# Patient Record
Sex: Male | Born: 1970 | Race: Black or African American | Hispanic: No | Marital: Married | Smoking: Never smoker
Health system: Southern US, Community
[De-identification: ages and names within clinical notes are randomized; demographics above are authoritative.]

## PROBLEM LIST (undated history)

## (undated) DIAGNOSIS — F419 Anxiety disorder, unspecified: Secondary | ICD-10-CM

## (undated) DIAGNOSIS — J392 Other diseases of pharynx: Secondary | ICD-10-CM

## (undated) DIAGNOSIS — J45909 Unspecified asthma, uncomplicated: Secondary | ICD-10-CM

## (undated) DIAGNOSIS — J349 Unspecified disorder of nose and nasal sinuses: Secondary | ICD-10-CM

## (undated) DIAGNOSIS — R12 Heartburn: Secondary | ICD-10-CM

## (undated) DIAGNOSIS — G4730 Sleep apnea, unspecified: Secondary | ICD-10-CM

## (undated) DIAGNOSIS — I10 Essential (primary) hypertension: Secondary | ICD-10-CM

## (undated) HISTORY — DX: Heartburn: R12

## (undated) HISTORY — DX: Anxiety disorder, unspecified: F41.9

## (undated) HISTORY — DX: Sleep apnea, unspecified: G47.30

## (undated) HISTORY — PX: ROTATOR CUFF REPAIR: SHX139

## (undated) HISTORY — DX: Other diseases of pharynx: J39.2

## (undated) HISTORY — PX: HERNIA REPAIR: SHX51

## (undated) HISTORY — DX: Unspecified disorder of nose and nasal sinuses: J34.9

## (undated) HISTORY — DX: Unspecified asthma, uncomplicated: J45.909

## (undated) HISTORY — PX: SHOULDER SURGERY: SHX246

## (undated) HISTORY — PX: ANKLE FRACTURE SURGERY: SHX122

---

## 2014-03-03 DIAGNOSIS — F419 Anxiety disorder, unspecified: Secondary | ICD-10-CM | POA: Insufficient documentation

## 2014-03-03 DIAGNOSIS — I10 Essential (primary) hypertension: Secondary | ICD-10-CM | POA: Insufficient documentation

## 2014-03-22 ENCOUNTER — Ambulatory Visit: Payer: Commercial Managed Care - PPO | Attending: Foot and Ankle Surgery

## 2014-03-22 NOTE — Pre-Procedure Instructions (Addendum)
Patient had all pre-ops done with PCP. Will fax surgeon for results @7 [phone]. [phone] ext [phone].  Abx order faxed to pharmacy. Pre op orders entered  Patient suffers from severe anxiety especially  With surgeries. Requests give "heavy" meds when going under anesth.because he becomes combative. Will fax [facility] regarding mild stroke.   Pt stated he wished to have Hibiclens instructions as well as tips for surgery faxed to him and I did.  [facility] hospital had no info about patient so I have asked him to clarify which hospital might have the records

## 2014-03-26 NOTE — Pre-Procedure Instructions (Signed)
Spoke with patient re mild stroke. He said I misunderstood that he did not have mild stroke.

## 2014-03-29 NOTE — Pre-Procedure Instructions (Addendum)
refaxed surgeon for H/P and consent. Refaxed Romeo Apple for ekg strip @ [phone]

## 2014-03-29 NOTE — Anesthesia Preprocedure Evaluation (Addendum)
Anesthesia Evaluation    AIRWAY    Mallampati: I    TM distance: >3 FB  Neck ROM: full  Mouth Opening:full   CARDIOVASCULAR    cardiovascular exam normal       DENTAL    No notable dental hx     PULMONARY    pulmonary exam normal     OTHER FINDINGS          PSS Anesthesia Comments: HTN; Anxiety; OSA; Asthma; labs - WNL;  EKG - NSR, WNL; Medical clearance under "Notes" - "H&P" -J.Wu        Anesthesia Plan    ASA 2     general               (Risks and benefits with the common expectations discussed and patient wishes to proceed. )      intravenous induction   Detailed anesthesia plan: general LMA        Post op pain management: PNB single shot    informed consent obtained      pertinent labs reviewed

## 2014-03-30 ENCOUNTER — Ambulatory Visit: Payer: Commercial Managed Care - PPO | Admitting: Foot and Ankle Surgery

## 2014-03-30 ENCOUNTER — Encounter: Payer: Self-pay | Admitting: Physician Assistant

## 2014-03-30 ENCOUNTER — Ambulatory Visit: Payer: Commercial Managed Care - PPO | Admitting: Physician Assistant

## 2014-03-30 ENCOUNTER — Ambulatory Visit
Admission: RE | Admit: 2014-03-30 | Discharge: 2014-04-01 | Disposition: A | Discharge status: Home / Self Care | Payer: Commercial Managed Care - PPO | Source: Ambulatory Visit | Attending: Foot and Ankle Surgery | Admitting: Foot and Ankle Surgery

## 2014-03-30 ENCOUNTER — Encounter
Admission: RE | Disposition: A | Discharge status: Home / Self Care | Payer: Self-pay | Source: Ambulatory Visit | Attending: Foot and Ankle Surgery

## 2014-03-30 DIAGNOSIS — M959 Acquired deformity of musculoskeletal system, unspecified: Secondary | ICD-10-CM | POA: Insufficient documentation

## 2014-03-30 DIAGNOSIS — M6580 Other synovitis and tenosynovitis, unspecified site: Secondary | ICD-10-CM | POA: Insufficient documentation

## 2014-03-30 DIAGNOSIS — M24873 Other specific joint derangements of unspecified ankle, not elsewhere classified: Secondary | ICD-10-CM | POA: Insufficient documentation

## 2014-03-30 DIAGNOSIS — M898X9 Other specified disorders of bone, unspecified site: Secondary | ICD-10-CM | POA: Insufficient documentation

## 2014-03-30 DIAGNOSIS — M958 Other specified acquired deformities of musculoskeletal system: Secondary | ICD-10-CM | POA: Diagnosis present

## 2014-03-30 HISTORY — PX: ARTHROSCOPY, ANKLE: SHX3169

## 2014-03-30 HISTORY — PX: STABILIZATION, ANKLE, BROSTROM PROCEDURE: SHX5553

## 2014-03-30 HISTORY — PX: ARTHROPLASTY, ANKLE: SHX3097

## 2014-03-30 SURGERY — ARTHROSCOPY, ANKLE
Anesthesia: Anesthesia General | Site: Ankle | Laterality: Left | Wound class: Clean

## 2014-03-30 MED ORDER — CEFAZOLIN SODIUM 1 G IJ SOLR
INTRAMUSCULAR | Status: AC
Start: 2014-03-30 — End: ?
  Filled 2014-03-30: qty 1000

## 2014-03-30 MED ORDER — SODIUM CHLORIDE 0.9 % IV MBP
1.0000 g | Freq: Once | INTRAVENOUS | Status: AC
Start: 2014-03-30 — End: 2014-03-30
  Administered 2014-03-30: 1 g via INTRAVENOUS

## 2014-03-30 MED ORDER — ONDANSETRON HCL 4 MG/2ML IJ SOLN
4.0000 mg | Freq: Once | INTRAMUSCULAR | Status: DC | PRN
Start: 2014-03-30 — End: 2014-03-30

## 2014-03-30 MED ORDER — FAMOTIDINE 10 MG/ML IV SOLN (WRAP)
INTRAVENOUS | Status: DC | PRN
Start: 2014-03-30 — End: 2014-03-30
  Administered 2014-03-30: 20 mg via INTRAVENOUS

## 2014-03-30 MED ORDER — SODIUM CHLORIDE 0.9 % IR SOLN
Status: DC | PRN
Start: 2014-03-30 — End: 2014-03-30
  Administered 2014-03-30: 1000 mL

## 2014-03-30 MED ORDER — MIDAZOLAM HCL 2 MG/2ML IJ SOLN
INTRAMUSCULAR | Status: AC
Start: 2014-03-30 — End: ?
  Filled 2014-03-30: qty 2

## 2014-03-30 MED ORDER — HYDROMORPHONE HCL PF 1 MG/ML IJ SOLN
INTRAMUSCULAR | Status: AC
Start: 2014-03-30 — End: 2014-03-30
  Administered 2014-03-30: 0.5 mg via INTRAVENOUS
  Filled 2014-03-30: qty 1

## 2014-03-30 MED ORDER — LORAZEPAM 1 MG PO TABS
1.0000 mg | ORAL_TABLET | Freq: Every day | ORAL | Status: DC
Start: 2014-03-30 — End: 2014-04-01
  Administered 2014-03-30 – 2014-03-31 (×2): 1 mg via ORAL
  Filled 2014-03-30 (×3): qty 1

## 2014-03-30 MED ORDER — ONDANSETRON HCL 4 MG/2ML IJ SOLN
INTRAMUSCULAR | Status: DC | PRN
Start: 2014-03-30 — End: 2014-03-30
  Administered 2014-03-30: 4 mg via INTRAVENOUS

## 2014-03-30 MED ORDER — OXYCODONE HCL 5 MG PO TABS
15.0000 mg | ORAL_TABLET | ORAL | Status: DC | PRN
Start: 2014-03-30 — End: 2014-04-01
  Administered 2014-03-30 – 2014-03-31 (×6): 15 mg via ORAL
  Filled 2014-03-30 (×6): qty 3

## 2014-03-30 MED ORDER — HEPARIN SODIUM (PORCINE) 5000 UNIT/ML IJ SOLN
INTRAMUSCULAR | Status: AC
Start: 2014-03-30 — End: ?
  Filled 2014-03-30: qty 1

## 2014-03-30 MED ORDER — OXYCODONE HCL ER 10 MG PO T12A
10.0000 mg | EXTENDED_RELEASE_TABLET | Freq: Two times a day (BID) | ORAL | Status: DC
Start: 2014-03-30 — End: 2014-04-01
  Administered 2014-03-30 – 2014-03-31 (×3): 10 mg via ORAL
  Filled 2014-03-30 (×3): qty 1

## 2014-03-30 MED ORDER — FAMOTIDINE 20 MG/2ML IV SOLN
INTRAVENOUS | Status: AC
Start: 2014-03-30 — End: ?
  Filled 2014-03-30: qty 2

## 2014-03-30 MED ORDER — FENTANYL CITRATE 0.05 MG/ML IJ SOLN
INTRAMUSCULAR | Status: AC
Start: 2014-03-30 — End: ?
  Filled 2014-03-30: qty 2

## 2014-03-30 MED ORDER — DEXAMETHASONE SODIUM PHOSPHATE 4 MG/ML IJ SOLN (WRAP)
INTRAMUSCULAR | Status: DC | PRN
Start: 2014-03-30 — End: 2014-03-30
  Administered 2014-03-30: 4 mg via INTRAVENOUS

## 2014-03-30 MED ORDER — BUPIVACAINE HCL (PF) 0.5 % IJ SOLN
INTRAMUSCULAR | Status: AC
Start: 2014-03-30 — End: ?
  Filled 2014-03-30: qty 30

## 2014-03-30 MED ORDER — CEFAZOLIN SODIUM-DEXTROSE 2-3 GM-% IV SOLR
2.0000 g | Freq: Three times a day (TID) | INTRAVENOUS | Status: AC
Start: 2014-03-30 — End: 2014-03-31
  Administered 2014-03-30 – 2014-03-31 (×2): 2 g via INTRAVENOUS
  Filled 2014-03-30 (×2): qty 50

## 2014-03-30 MED ORDER — KETAMINE HCL 100 MG/ML IJ SOLN
INTRAMUSCULAR | Status: DC | PRN
Start: 2014-03-30 — End: 2014-03-30
  Administered 2014-03-30 (×2): 5 mg via INTRAVENOUS

## 2014-03-30 MED ORDER — ENOXAPARIN SODIUM 40 MG/0.4ML SC SOLN
40.0000 mg | Freq: Every morning | SUBCUTANEOUS | Status: DC
Start: 2014-03-31 — End: 2014-04-01
  Administered 2014-03-31 – 2014-04-01 (×2): 40 mg via SUBCUTANEOUS
  Filled 2014-03-30 (×2): qty 0.4

## 2014-03-30 MED ORDER — KETAMINE HCL 50 MG/ML IJ SOLN
INTRAMUSCULAR | Status: AC
Start: 2014-03-30 — End: ?
  Filled 2014-03-30: qty 10

## 2014-03-30 MED ORDER — SODIUM CHLORIDE 0.9% BAG (IRRIGATION USE)
INTRAVENOUS | Status: DC | PRN
Start: 2014-03-30 — End: 2014-03-30
  Administered 2014-03-30: 1000 mL

## 2014-03-30 MED ORDER — DEXAMETHASONE SOD PHOSPHATE PF 10 MG/ML IJ SOLN
INTRAMUSCULAR | Status: AC
Start: 2014-03-30 — End: ?
  Filled 2014-03-30: qty 1

## 2014-03-30 MED ORDER — PROPOFOL INFUSION 10 MG/ML
INTRAVENOUS | Status: DC | PRN
Start: 2014-03-30 — End: 2014-03-30
  Administered 2014-03-30: 30 mg via INTRAVENOUS
  Administered 2014-03-30: 50 mg via INTRAVENOUS
  Administered 2014-03-30: 40 mg via INTRAVENOUS
  Administered 2014-03-30: 30 mg via INTRAVENOUS
  Administered 2014-03-30: 40 mg via INTRAVENOUS

## 2014-03-30 MED ORDER — OXYCODONE-ACETAMINOPHEN 5-325 MG PO TABS
1.0000 | ORAL_TABLET | Freq: Once | ORAL | Status: DC | PRN
Start: 2014-03-30 — End: 2014-03-30

## 2014-03-30 MED ORDER — FENTANYL CITRATE 0.05 MG/ML IJ SOLN
INTRAMUSCULAR | Status: AC
Start: 2014-03-30 — End: 2014-03-30
  Administered 2014-03-30: 50 ug via INTRAVENOUS
  Filled 2014-03-30: qty 2

## 2014-03-30 MED ORDER — ROPIVACAINE HCL 5 MG/ML IJ SOLN
INTRAMUSCULAR | Status: AC
Start: 2014-03-30 — End: ?
  Filled 2014-03-30: qty 60

## 2014-03-30 MED ORDER — SODIUM CHLORIDE 0.9 % IV SOLN
INTRAVENOUS | Status: DC
Start: 2014-03-30 — End: 2014-03-30

## 2014-03-30 MED ORDER — MINERAL OIL LIGHT 100 % EX OIL
TOPICAL_OIL | CUTANEOUS | Status: AC
Start: 2014-03-30 — End: ?
  Filled 2014-03-30: qty 25

## 2014-03-30 MED ORDER — ALBUTEROL SULFATE HFA 108 (90 BASE) MCG/ACT IN AERS
2.0000 | INHALATION_SPRAY | Freq: Four times a day (QID) | RESPIRATORY_TRACT | Status: DC | PRN
Start: 2014-03-30 — End: 2014-04-01
  Filled 2014-03-30: qty 1

## 2014-03-30 MED ORDER — PROPOFOL INFUSION 10 MG/ML
INTRAVENOUS | Status: DC | PRN
Start: 2014-03-30 — End: 2014-03-30
  Administered 2014-03-30: 50 ug/kg/min via INTRAVENOUS

## 2014-03-30 MED ORDER — BUPIVACAINE-EPINEPHRINE (PF) 0.5% -1:200000 IJ SOLN
INTRAMUSCULAR | Status: AC
Start: 2014-03-30 — End: ?
  Filled 2014-03-30: qty 30

## 2014-03-30 MED ORDER — FENTANYL CITRATE 0.05 MG/ML IJ SOLN
50.0000 ug | INTRAMUSCULAR | Status: DC | PRN
Start: 2014-03-30 — End: 2014-03-30
  Administered 2014-03-30: 50 ug via INTRAVENOUS

## 2014-03-30 MED ORDER — CALCIUM CITRATE-VITAMIN D 315-250 MG-UNIT PO TABS
2.0000 | ORAL_TABLET | Freq: Every morning | ORAL | Status: DC
Start: 2014-03-31 — End: 2014-04-01
  Administered 2014-03-31 – 2014-04-01 (×2): 2 via ORAL
  Filled 2014-03-30 (×2): qty 2

## 2014-03-30 MED ORDER — HYDROMORPHONE HCL PF 1 MG/ML IJ SOLN
1.0000 mg | INTRAMUSCULAR | Status: DC | PRN
Start: 2014-03-30 — End: 2014-04-01
  Administered 2014-03-31: 1 mg via INTRAVENOUS
  Filled 2014-03-30: qty 1

## 2014-03-30 MED ORDER — BUPIVACAINE-EPINEPHRINE (PF) 0.5% -1:200000 IJ SOLN
INTRAMUSCULAR | Status: DC | PRN
Start: 2014-03-30 — End: 2014-03-30
  Administered 2014-03-30: 10 mL

## 2014-03-30 MED ORDER — NALOXONE HCL 0.4 MG/ML IJ SOLN
0.4000 mg | INTRAMUSCULAR | Status: DC | PRN
Start: 2014-03-30 — End: 2014-04-01

## 2014-03-30 MED ORDER — FENTANYL CITRATE 0.05 MG/ML IJ SOLN
INTRAMUSCULAR | Status: DC | PRN
Start: 2014-03-30 — End: 2014-03-30
  Administered 2014-03-30 (×2): 50 ug via INTRAVENOUS

## 2014-03-30 MED ORDER — AMLODIPINE BESYLATE 5 MG PO TABS
10.0000 mg | ORAL_TABLET | Freq: Every day | ORAL | Status: DC
Start: 2014-03-31 — End: 2014-04-01
  Administered 2014-03-31 – 2014-04-01 (×2): 10 mg via ORAL
  Filled 2014-03-30 (×2): qty 2

## 2014-03-30 MED ORDER — GLYCOPYRROLATE 0.2 MG/ML IJ SOLN
INTRAMUSCULAR | Status: DC | PRN
Start: 2014-03-30 — End: 2014-03-30
  Administered 2014-03-30: 0.2 mg via INTRAVENOUS
  Administered 2014-03-30: 0.1 mg via INTRAVENOUS

## 2014-03-30 MED ORDER — LIDOCAINE HCL (PF) 1 % IJ SOLN
INTRAMUSCULAR | Status: AC
Start: 2014-03-30 — End: ?
  Filled 2014-03-30: qty 30

## 2014-03-30 MED ORDER — DEXAMETHASONE SODIUM PHOSPHATE 4 MG/ML IJ SOLN
INTRAMUSCULAR | Status: AC
Start: 2014-03-30 — End: ?
  Filled 2014-03-30: qty 1

## 2014-03-30 MED ORDER — ASPIRIN 325 MG PO TBEC
325.0000 mg | DELAYED_RELEASE_TABLET | Freq: Every morning | ORAL | Status: DC
Start: 2014-03-31 — End: 2014-04-01
  Filled 2014-03-30 (×2): qty 1

## 2014-03-30 MED ORDER — LACTATED RINGERS IV SOLN
INTRAVENOUS | Status: DC
Start: 2014-03-30 — End: 2014-03-30

## 2014-03-30 MED ORDER — MIDAZOLAM HCL 2 MG/2ML IJ SOLN
2.0000 mg | Freq: Once | INTRAMUSCULAR | Status: AC
Start: 2014-03-30 — End: 2014-03-30
  Administered 2014-03-30: 2 mg via INTRAVENOUS

## 2014-03-30 MED ORDER — MEPERIDINE HCL 25 MG/ML IJ SOLN
12.5000 mg | Freq: Once | INTRAMUSCULAR | Status: DC | PRN
Start: 2014-03-30 — End: 2014-03-30

## 2014-03-30 MED ORDER — ONDANSETRON HCL 4 MG/2ML IJ SOLN
INTRAMUSCULAR | Status: AC
Start: 2014-03-30 — End: ?
  Filled 2014-03-30: qty 2

## 2014-03-30 MED ORDER — HYDROMORPHONE HCL PF 1 MG/ML IJ SOLN
0.5000 mg | INTRAMUSCULAR | Status: DC | PRN
Start: 2014-03-30 — End: 2014-03-30

## 2014-03-30 MED ORDER — OXYCODONE HCL 5 MG PO TABS
10.0000 mg | ORAL_TABLET | ORAL | Status: DC | PRN
Start: 2014-03-30 — End: 2014-04-01

## 2014-03-30 MED ORDER — PROMETHAZINE HCL 25 MG/ML IJ SOLN
6.2500 mg | Freq: Once | INTRAMUSCULAR | Status: DC | PRN
Start: 2014-03-30 — End: 2014-03-30

## 2014-03-30 MED ORDER — OXYCODONE HCL 5 MG PO TABS
5.0000 mg | ORAL_TABLET | ORAL | Status: DC | PRN
Start: 2014-03-30 — End: 2014-04-01

## 2014-03-30 MED ORDER — LIDOCAINE HCL 2 % IJ SOLN
INTRAMUSCULAR | Status: DC | PRN
Start: 2014-03-30 — End: 2014-03-30
  Administered 2014-03-30: 40 mg

## 2014-03-30 MED ORDER — PROPOFOL 10 MG/ML IV EMUL
INTRAVENOUS | Status: AC
Start: 2014-03-30 — End: ?
  Filled 2014-03-30: qty 60

## 2014-03-30 MED ORDER — LACTATED RINGERS IV SOLN
INTRAVENOUS | Status: DC
Start: 2014-03-30 — End: 2014-04-01

## 2014-03-30 MED ORDER — ROPIVACAINE HCL 5 MG/ML IJ SOLN
INTRAMUSCULAR | Status: DC | PRN
Start: 2014-03-30 — End: 2014-03-30
  Administered 2014-03-30: 20 mL via PERINEURAL
  Administered 2014-03-30: 30 mL via PERINEURAL

## 2014-03-30 MED ORDER — MIDAZOLAM HCL 2 MG/2ML IJ SOLN
2.0000 mg | Freq: Once | INTRAMUSCULAR | Status: DC
Start: 2014-03-30 — End: 2014-03-30

## 2014-03-30 MED ORDER — MIDAZOLAM HCL 2 MG/2ML IJ SOLN
INTRAMUSCULAR | Status: DC | PRN
Start: 2014-03-30 — End: 2014-03-30
  Administered 2014-03-30: 2 mg via INTRAVENOUS

## 2014-03-30 SURGICAL SUPPLY — 72 items
BANDAGE CMPR PLSTR CTTN CRTY CNFRM 75X4 (Bandage)
BANDAGE CMPR PLSTR CTTN MED MTRX 5YDX6IN (Bandage) ×2
BANDAGE CURITY CONFORM COMPRESSION L75 IN X W4 IN 1 PLY HIGH ABSORBENT (Bandage) IMPLANT
BANDAGE ELASTIC L5 YD X W6 IN W/SELF-CLOSURE HOOK (Bandage) ×2 IMPLANT
BANDAGE MEDIUM COMPRESSION L5 YD X W4 IN ELASTIC HOOK LOOP CLOSURE (Bandage) ×1 IMPLANT
BANDAGE MEDLINE MEDIUM COMPRESSION L5 YD (Bandage) ×3
BLADE S/SU RIBBACK CARB STL 15 (Blade) ×8 IMPLANT
BNDG ACE ELASTIC 4IN STRL (Procedure Accessories) ×4 IMPLANT
BNDG MTRX CMPR 5YDX4IN MED PLSTR CTTN (Bandage) ×1
CLOTH BEACON TIMEOUT ORANGE (Other) ×2 IMPLANT
CNSR MEDIVAC CRD LINER W LID (Suction) ×2 IMPLANT
COVER LIGHT HANDLE 2PK (Procedure Accessories) ×4 IMPLANT
DRAPE 3/4 SHEET FANFLD 52X76IN (Drape) ×2 IMPLANT
DRESSING OIL EMULSION 3X8 (Dressing) IMPLANT
GLOVE SRG NTR RBR 8 INDCTR BGL 299X103MM (Glove) ×1
GLOVE SURG BIOGEL INDIC SZ 7.5 (Glove) ×2 IMPLANT
GLOVE SURG BIOGEL ORTHO SZ7.5 (Glove) ×2 IMPLANT
GLOVE SURG BIOGEL SZ7.5 (Glove) ×4 IMPLANT
GLOVE SURGICAL 8 INDICATOR BIOGEL POWDER (Glove) ×1
GLOVE SURGICAL 8 INDICATOR BIOGEL POWDER FREE SMOOTH BEAD CUFF (Glove) ×1 IMPLANT
GOWN SMART IMPERVIOUS LARGE (Gown) ×6 IMPLANT
GRAFT BN ALGRF FSH LT (Allograft) ×2 IMPLANT
GRAFT BONE ALLOGRAFT FRESH LEFT (Allograft) ×1 IMPLANT
HNDPC INTRPLS SUCT (Other) ×2 IMPLANT
INACTIVE USE LAWSON 93583 (Gown) ×2 IMPLANT
JUG OMNI 15000CC W SPECIMEN CA (Suction) ×2 IMPLANT
KIT INFECTION CONTROL CUSTOM (Kits) ×2
KIT INFECTION CONTROL CUSTOM IFOH03 (Kits) ×1 IMPLANT
KIT OATS 8MM SMALL JOINT (Ortho Supply) ×2
MODULE KNEE ARTHROSCOPY FOH (Pack) ×2 IMPLANT
PACK DRAPE C-ARM FIT MINI-6600 (Pack) ×2 IMPLANT
PACK SRG 60ML BMAC LF STRL BN MRW DISP (Pack) ×2
PACK SURGICAL BMAC BONE MARROW STERILE DISPOSABLE LATEX FREE 60ML (Pack) ×1 IMPLANT
PAD ABD PVC CRTY 9X5IN LF STRL 3 LYR (Dressing) ×4 IMPLANT
PADDING CAST L4 YD X W4 IN COHESION HAND TEARABLE SELF BOND SPECIALIST (Procedure Accessories) ×1 IMPLANT
PADDING CAST L4 YD X W4 IN UNDERCAST (Cast) ×3
PADDING CAST L4 YD X W4 IN UNDERCAST MILD STRETCH COHESIVE REGULAR (Cast) ×3 IMPLANT
PADDING CST CTTN SPCLST 100 4YDX4IN LF (Procedure Accessories) ×2
PADDING CST CTTN WBRL 4YDX4IN LF STRL (Cast) ×3
PROCELLERA ANTIMICROBIAL WOUND DRESSING ×2 IMPLANT
RESECTOR 3.5MM (Ortho Supply) ×2 IMPLANT
SET GRAFT HARVEST OD8 MM OATS SMALL JOINT (Ortho Supply) ×1 IMPLANT
SET OATS HRVSTG SM JOINT 6MM (Procedure Accessories) ×2 IMPLANT
SOL ALCOHOL ISOPROPYL 70% 4 OZ (Prep) ×2 IMPLANT
SOLUTION IRR 0.9% NACL 3L ARTHMTC LF (Irrigation Solutions) ×2
SOLUTION IRRIGATION 0.9% SODIUM CHLORIDE (Irrigation Solutions) ×2 IMPLANT
SPONGE CHLRPRP TINT 26ML (Applicator) ×4 IMPLANT
SPONGE GAUZE L4 IN X W4 IN 12 PLY (Sponge) ×2 IMPLANT
SPONGE GZE PLS CTTN CRTY 4X4IN LF STRL (Sponge) ×2
STAPLER SKIN L4.1 MM X W6.5 MM 35 WIDE (Staplers) ×1
STAPLER SKIN L4.1 MM X W6.5 MM 35 WIDE STAPLE CARTRIDGE APPOSE ULC (Staplers) ×1 IMPLANT
STAPLER SKN SS PLS APS U 4.1X6.5MM LF 35 (Staplers) ×1
STRAP ANKLE DISTRACTOR DISP X6 (Procedure Accessories) ×2 IMPLANT
SUTURE ABS 2-0 CT1 VCL 18IN CR BRD 8 (Suture) ×1
SUTURE ABS 2-0 CT1 VCL 27IN BRD COAT (Suture)
SUTURE ABS 3-0 PS2 MNCRL MTPS 18IN MFL (Suture) ×1
SUTURE BLUE 0 L38 IN NONABSORBABLE (Needles) ×2
SUTURE COATED VICRYL 2-0 CT-1 18IN CNTRL BRD 8 STRND UNDYED (Suture) ×1 IMPLANT
SUTURE COATED VICRYL 2-0 CT-1 L18 IN (Suture) ×1
SUTURE COATED VICRYL 2-0 CT-1 L27 IN (Suture)
SUTURE COATED VICRYL 2-0 CT-1 L27 IN BRAID COATED VIOLET ABSORBABLE (Suture) IMPLANT
SUTURE FIBERWIRE BLUE 0 L38 IN NONABSORBABLE (Needles) ×2 IMPLANT
SUTURE MONOCRYL 3-0 PS-2 L18 IN (Suture) ×1
SUTURE MONOCRYL 3-0 PS-2 L18 IN MONOFILAMENT UNDYED ABSORBABLE (Suture) ×1 IMPLANT
SUTURE NABSB 0 FBRWR 38IN BLU (Needles) ×2
SUTURE VICRYL 0 CT1 8X27IN (Suture) ×2 IMPLANT
TAPE SCOTCHCAST+ 4INX4YD (Cast) ×2 IMPLANT
TOURNIQUET 34IN STRL (Procedure Accessories) IMPLANT
TOWEL L26 IN X W17 IN COTTON PREWASH DELINT BLUE ACTISORB DELUXE (Procedure Accessories) ×2 IMPLANT
TOWEL SRG CTTN 26X17IN LF STRL PREWASH (Procedure Accessories) ×4
TRAY BASIN MINOR (Tray) ×2 IMPLANT
TUBING FLOCNTRL ARTHRSCPY PUMP (Ortho Supply) ×2 IMPLANT

## 2014-03-30 NOTE — Anesthesia Postprocedure Evaluation (Addendum)
Anesthesia Post Evaluation    Patient: Ricky Huffman    Procedures performed: Procedure(s) with comments:  ARTHROSCOPY, ANKLE - LEFT ANKLE SCOPE WITH DEBRIDEMENT, BROSTROM REPAIR AND ARTHROPLASTY    STABILIZATION, ANKLE, BROSTROM PROCEDURE  ARTHROPLASTY, ANKLE - Allograft (Oats)    Anesthesia type: General LMA    Patient location:Phase I PACU    Last vitals: VSS.      Post pain: Patient not complaining of pain, continue current therapy      Mental Status:awake and alert     Respiratory Function: tolerating room air    Cardiovascular: stable    Nausea/Vomiting: patient not complaining of nausea or vomiting    Hydration Status: adequate    Post assessment: no apparent anesthetic complications

## 2014-03-30 NOTE — Anesthesia Procedure Notes (Addendum)
Peripheral    Patient location during procedure: pre-op  Start time: 03/30/2014 1:00 PM  End time: 03/30/2014 1:08 PM  Block Region: lower extremity  Block type: popliteal sciatic  Laterality: left  Injection technique: single-shotBlock at surgeon's request YesReason for block: post-op pain management  Staffing  Anesthesiologist: Joplin Canty LEE  Performed by: Anesthesiologist Preanesthetic Checklist Completed: patient identified, surgical consent, pre-op evaluation, timeout performed, risks and benefits discussed, anesthesia consent given and correct site  Timeout Completed:  03/30/2014 1:08 PM  Peripheral Block  Patient monitoring: NIBP and pulse oximetry  Patient position: supineSterile Technique: ChloraPrep and sterile glovesPremedication: yes and meaningful contact maintained  Procedures: ultrasound guided and paresthesia technique  Local infiltration: lidocaine 1%  Needle  Needle type: Stimuplex     Needle gauge: 21 G    Needle length: 4 in            Ultrasound Guided: LA spread visualized, Needle visualized, Image stored or printed and Relevant anatomy identified (nerve, vessels, muscle)    Assessment Incremental injection: yes    Injection made incrementally with aspirations every 3 mL.    Injection Resistance: no  Paresthesia Pain: transient    Blood Aspirated: No    no suspected intravascular injection  Block Outcome: patient tolerated procedure well, no complications and successful block    Peripheral    Patient location during procedure: pre-op  Start time: 03/30/2014 1:08 PM  End time: 03/30/2014 1:13 PM  Block Region: lower extremity  Block type: saphenous  Laterality: left  Injection technique: single-shotBlock at surgeon's request YesReason for block: post-op pain management  Staffing  Anesthesiologist: Gianny Sabino LEE  Performed by: Anesthesiologist Preanesthetic Checklist Completed: patient identified, surgical consent, pre-op evaluation, timeout performed, risks and benefits discussed, anesthesia  consent given and correct site  Timeout Completed:  03/30/2014 12:55 PM  Peripheral Block  Patient monitoring: NIBP, pulse oximetry and nasal cannula O2  Patient position: supineSterile Technique: ChloraPrep and sterile glovesPremedication: yes and meaningful contact maintained  Procedures: ultrasound guided  Local infiltration: lidocaine 1%  Needle  Needle type: Stimuplex     Needle gauge: 21 G    Needle length: 4 in            Ultrasound Guided: LA spread visualized, Needle visualized, Relevant anatomy identified (nerve, vessels, muscle) and Image stored or printed    Assessment Incremental injection: yes    Injection made incrementally with aspirations every 3 mL.    Injection Resistance: no  Paresthesia Pain: no    Blood Aspirated: No    no suspected intravascular injection  Block Outcome: patient tolerated procedure well, no complications and successful block

## 2014-03-30 NOTE — Progress Notes (Signed)
Patient admitted from PACU via stretcher 1715. S/p left ankle arthroplasty. Ace wrap in place to left LE. Patient states "it feel weird like it is asleep". Patient received nerve block for surgery-able to wiggle toes left foot,skin warm and dry. Tolerating ice chips. Left LE elevated on pillows. Oriented to room,call bell in reach

## 2014-03-30 NOTE — Interval H&P Note (Signed)
I have reviewed the H&P, examined the patient and there are no changes.

## 2014-03-30 NOTE — PACU (Signed)
BP 151/99, dr Garner Nash made aware, no orders given.  ok'd for [place] to floor.

## 2014-03-30 NOTE — Brief Op Note (Signed)
BRIEF OP NOTE    Date Time: 03/30/2014 3:49 PM    Patient Name:   Marlen Mollica    Date of Operation:   03/30/2014    Providers Performing:   Surgeon(s):  [place], Mcarthur Rossetti, DO  Sande Pickert, Kathi Der, MD  Hyams, Marline Backbone, [place]    Assistant (s):   Mangoba, Arlee Muslim, RN - Circulator  Cammy Copa R - Scrub Person  Couey, Phillips Odor, RN - Relief Scrub  Kristeen Miss A - Relief Scrub    Operative Procedure:   Procedure(s):  ARTHROSCOPY, ANKLE LEFT  STABILIZATION, ANKLE, BROSTROM PROCEDURE  ARTHROPLASTY, ANKLE (OATs)    Preoperative Diagnosis:   Pre-Op Diagnosis Codes:     * Pain in joint, ankle and foot, left [719.47]     * Acquired musculoskeletal deformity of other specified site [738.8]     * Other joint derangement, not elsewhere classified, ankle and foot [718.87]     * Tenosynovitis of foot and ankle [727.06]     * Effusion of ankle and foot joint [719.07]    Postoperative Diagnosis:   Pain in joint, ankle and foot, left [1011474],Acquired musculoskeletal deformity of other specified site [738.8], Tenosynovitis of foot and ankle [727.06][    Anesthesia:   General    Estimated Blood Loss:   Min     Implants:     Implant Name Type Inv. Item Serial No. Manufacturer Lot No. LRB No. Used Action   Left Talus Allograft         Left 1 Implanted       Drains:   Drains: no    Specimens:       Findings:   See dictation    Complications:   None       Signed by: Wilford Sports, MD                                                                           [place] [facility]

## 2014-03-30 NOTE — Progress Notes (Signed)
Block procedure completed with sedation. Time out performed with anesthesiologist. Patient tolerated the procedure with no adverse side effects. Procedure discussed prior to start. Peripheral nerve block education initiated, including fall precautions/sensory deficits and pain management.  Patient and responsible adult verbalized understanding.

## 2014-03-30 NOTE — Transfer of Care (Signed)
Anesthesia Transfer of Care Note    Patient: Ricky Huffman    Procedures performed: Procedure(s) with comments:  ARTHROSCOPY, ANKLE - LEFT ANKLE SCOPE WITH DEBRIDEMENT, BROSTROM REPAIR AND ARTHROPLASTY    STABILIZATION, ANKLE, BROSTROM PROCEDURE  ARTHROPLASTY, ANKLE - Allograft (Oats)    Anesthesia type: General LMA    Patient location:Phase I PACU    Last vitals:   Filed Vitals:    03/30/14 1315   BP: 183/103   Pulse: 87   Temp:    Resp:    SpO2: 99%       Post pain: Patient not complaining of pain, continue current therapy      Mental Status:awake    Respiratory Function: tolerating face mask    Cardiovascular: stable    Nausea/Vomiting: patient not complaining of nausea or vomiting    Hydration Status: adequate    Post assessment: no apparent anesthetic complications

## 2014-03-31 ENCOUNTER — Other Ambulatory Visit: Payer: Self-pay

## 2014-03-31 LAB — HEMOGLOBIN AND HEMATOCRIT, BLOOD
Hematocrit: 40.7 % — ABNORMAL LOW (ref 42.0–52.0)
Hgb: 13.9 g/dL (ref 13.0–17.0)

## 2014-03-31 MED ORDER — ONDANSETRON 4 MG PO TBDP
4.0000 mg | ORAL_TABLET | Freq: Four times a day (QID) | ORAL | 1 refills | Status: DC | PRN
Start: 2014-03-31 — End: 2017-05-13
  Filled 2014-03-31: qty 12, 3d supply, fill #0

## 2014-03-31 MED ORDER — ASPIRIN 325 MG PO TBEC
325.0000 mg | DELAYED_RELEASE_TABLET | Freq: Two times a day (BID) | ORAL | 0 refills | Status: DC
Start: 2014-03-31 — End: 2014-04-01
  Filled 2014-03-31: qty 30, 15d supply, fill #0

## 2014-03-31 MED ORDER — OXYCODONE HCL ER 20 MG PO T12A
20.0000 mg | EXTENDED_RELEASE_TABLET | Freq: Two times a day (BID) | ORAL | 0 refills | Status: DC | PRN
Start: 2014-03-31 — End: 2017-05-13
  Filled 2014-03-31: qty 20, 10d supply, fill #0

## 2014-03-31 MED ORDER — ONDANSETRON HCL 4 MG PO TABS
4.0000 mg | ORAL_TABLET | Freq: Three times a day (TID) | ORAL | Status: DC | PRN
Start: 2014-03-31 — End: 2014-04-01
  Administered 2014-03-31: 4 mg via ORAL
  Filled 2014-03-31: qty 1

## 2014-03-31 MED ORDER — ENOXAPARIN SODIUM 40 MG/0.4ML SC SOLN
40.0000 mg | Freq: Every day | SUBCUTANEOUS | 0 refills | Status: DC
Start: 2014-03-31 — End: 2017-06-02
  Filled 2014-03-31: qty 12, 30d supply, fill #0
  Filled 2014-04-14: qty 6, 15d supply, fill #1

## 2014-03-31 MED ORDER — CEPHALEXIN 500 MG PO CAPS
500.0000 mg | ORAL_CAPSULE | Freq: Two times a day (BID) | ORAL | 0 refills | Status: DC
Start: 2014-03-31 — End: 2017-05-13
  Filled 2014-03-31: qty 10, 5d supply, fill #0

## 2014-03-31 MED ORDER — OXYCODONE HCL 5 MG PO TABS
5.0000 mg | ORAL_TABLET | ORAL | 0 refills | Status: DC | PRN
Start: 2014-03-31 — End: 2017-05-13
  Filled 2014-03-31: qty 60, 4d supply, fill #0

## 2014-03-31 MED ORDER — TERBINAFINE HCL 250 MG PO TABS
250.0000 mg | ORAL_TABLET | Freq: Every evening | ORAL | 1 refills | Status: DC
Start: 2014-03-31 — End: 2017-05-13
  Filled 2014-03-31: qty 30, 30d supply, fill #0
  Filled 2014-05-01: qty 30, 30d supply, fill #1

## 2014-03-31 MED ORDER — HYDROMORPHONE HCL 4 MG PO TABS
4.0000 mg | ORAL_TABLET | ORAL | Status: DC | PRN
Start: 2014-03-31 — End: 2014-04-01
  Administered 2014-03-31 – 2014-04-01 (×8): 4 mg via ORAL
  Filled 2014-03-31 (×8): qty 1

## 2014-03-31 MED ORDER — DOCUSATE SODIUM 100 MG PO CAPS
100.0000 mg | ORAL_CAPSULE | Freq: Two times a day (BID) | ORAL | 0 refills | Status: DC
Start: 2014-03-31 — End: 2017-06-02
  Filled 2014-03-31: qty 30, 15d supply, fill #0

## 2014-03-31 MED ORDER — ACETAMINOPHEN 325 MG PO TABS
650.0000 mg | ORAL_TABLET | Freq: Four times a day (QID) | ORAL | 0 refills | Status: DC | PRN
Start: 2014-03-31 — End: 2017-05-13
  Filled 2014-03-31: qty 30, 4d supply, fill #0

## 2014-03-31 MED ORDER — ONDANSETRON HCL 4 MG PO TABS
4.0000 mg | ORAL_TABLET | Freq: Three times a day (TID) | ORAL | Status: DC
Start: 2014-03-31 — End: 2014-03-31

## 2014-03-31 MED ORDER — PROMETHAZINE HCL 25 MG/ML IJ SOLN
6.2500 mg | Freq: Once | INTRAMUSCULAR | Status: AC
Start: 2014-03-31 — End: 2014-03-31
  Administered 2014-03-31: 6.25 mg via INTRAVENOUS
  Filled 2014-03-31: qty 1

## 2014-03-31 MED ORDER — DOCUSATE SODIUM 100 MG PO CAPS
100.0000 mg | ORAL_CAPSULE | Freq: Every day | ORAL | Status: DC
Start: 2014-03-31 — End: 2014-04-01
  Administered 2014-04-01: 100 mg via ORAL
  Filled 2014-03-31 (×2): qty 1

## 2014-03-31 NOTE — Progress Notes (Addendum)
Introduced [employer] and self to patient (and): yes  Identified discharge needs (Y, N or none at this time): given BSC  Follow-up appointment with MD arranged (LACE score of 10 & up) (Y/N) 1  Provided CM business card and CM brochure (Y/N):  Tentative d/c date and CM # on white board: today  Comments: no CM needs    Max Fickle RN BSN CMSRN   Case Manager ext [phone]

## 2014-03-31 NOTE — PT Eval Note (Signed)
[facility]  Physical Therapy Evaluation    Patient: Ricky Huffman MRN: [id number]   Unit: 5NEW ORTHOPEDICS    Bed: N829/F621-30    Assessment:   Assessment: Decreased endurance/activity tolerance;Decreased functional mobility;Gait impairment, resulting in difficulty with ADLs.  Prognosis: Good    Interdisciplinary Communication:   Communicated with  RN re pt needs another session of PT    Plan:   Recommendation  Discharge Recommendation: Home with supervision  DME Recommended for Discharge:  [facility])  PT - Next Visit Recommendation: 03/31/14  PT Frequency: 4-5x/wk  Patient Goal:  (home today)   Risks/Benefits/POC Discussed with Pt/Family: With patient/family   Treatment/Interventions: Exercise;Gait training     Goals  Goal Formulation: With patient/family  Time for Goal Acheivement: 1 visit  Goals: Select goal  Pt Will Ambulate: 11-30 feet;with rolling walker;Independently  Pt Will Go Up / Down Stairs: 3-5 stairs;With stand by assist  Pt Will Perform Home Exer Program: Independently    Education:   Educated patient/caregiver to role of physical therapy, plan of care, transfer training techniques,car transfers, gait training techniques with RW, home safety,  exercises for lower extremity including  SLR.      Patient/caregiver demonstrated good understanding.  Will benefit from further training of gait on steps.  Discussed goals of therapy with patient/caregiver, in agreement with plan.    Evaluation:   Consult received for Ricky Huffman for PT evaluation and treatment.  Chart reviewed.  Patient's medical condition is appropriate for Physical Therapy intervention at this time.     Medical Diagnosis: Pain in joint, ankle and foot, left [719.47] (Pain in Ankle)  Acquired musculoskeletal deformity of other specified site [738.8] (osteochondral defect)  Other joint derangement, not elsewhere classified, ankle and foot [718.87] (ankle instability)  Tenosynovitis of foot and ankle [727.06] (synovitis of  ankle)  Effusion of ankle and foot joint [719.07] (effusion of ankle joint)  Osteochondral defect of ankle    Therapy Diagnosis: difficulty walking,generalized muscle weakness    Precautions  Weight Bearing Status: LLE non weight bearing    History of Present Illness: Ricky Huffman is a 43 y.o. male admitted on 03/30/2014 with  R total ankle    Patient Active Problem List   Diagnosis   . Osteochondral defect of ankle     Past Medical History   Diagnosis Date   . Sleep apnea      has o2 by bedside    . Asthma without status asthmaticus      rare use of rescue inhaler will bring   . Heartburn      prilosec   . Difficulty in walking(719.7)    . Ear, nose and throat disorder      seasonal allergies   . Anxiety      due to sedation   . Claustrophobia      Past Surgical History   Procedure Date   . Rotator cuff repair    . Hernia repair      inguinal         Prior Level of Function  Prior level of function: Independent with ADLs  Baseline Activity Level: Community ambulation  Driving: independent  Cooking: Yes  Employment: FT  DME Currently at Home: [employer] walker  Home Living Arrangements  Living Arrangements: Spouse/significant other  Type of Home: House  Home Layout: Two level;Able to live on main level with bedroom/bathroom;Stairs to enter without rails (add number in comment) (2 steps into home)  DME Currently at Home:  Front wheel walker    Subjective: Patient is agreeable to participation in the therapy session.   Pain Assessment  Pain Assessment: No/denies pain (c/o lightheadedness)          Objective:  Patient is in bed with dressings in place.    Observation of patient/vitals     Cognition  Arousal/Alertness: Appropriate responses to stimuli  Following Commands: Follows all commands and directions without difficulty       Musculoskeletal Examination  Gross ROM  Right Upper Extremity ROM: within functional limits  Left Upper Extremity ROM: within functional limits (x shoulder flex to 140 abd to 120 sp rotator  cuff surgery)  Right Lower Extremity ROM: within functional limits  Left Lower Extremity ROM: within functional limits (ankle in post splint)    Sensation:  Decreased toes due to n block    Gross Strength  Right Upper Extremity Strength: within functional limits  Left Upper Extremity Strength: within functional limits  Right Lower Extremity Strength: within functional limits  Left Lower Extremity Strength: within functional limits                Functional Mobility  Supine to Sit: Independent  Sit to Stand: Independent  Stand to Sit: Independent       Balance  Balance: within functional limits    Locomotion  Ambulation: stand by assistance;with front-wheeled walker (5 x 2,declined further gait due to c/o dizziness)  Stair Management: not attempted    Participation and Endurance  Participation Effort: fair  Endurance: Tolerates 30 min exercise with multiple rests     Time Calculation  PT Received On: 03/31/14  Start Time: 1045  Stop Time: 1115  Time Calculation (min): 30 min    Tiburcio Pea, PT

## 2014-03-31 NOTE — Progress Notes (Signed)
Orthopedic Foot/Ankle Daily Progress Note    03/31/2014   5:43 AM    Ricky Huffman is a 43 y.o. male  1 Day Post-Op  S/p Procedure(s) (LRB):  ARTHROSCOPY, ANKLE (Left)  STABILIZATION, ANKLE, BROSTROM PROCEDURE (Left)  ARTHROPLASTY, ANKLE (Left)   Doing well, pain controlled  Physical Exam:  Filed Vitals:    03/31/14 0439   BP: 126/71   Pulse: 65   Temp: 96.6 F (35.9 C)   Resp: 18   SpO2: 97%        Intake/Output Summary (Last 24 hours) at 03/31/14 0543  Last data filed at 03/31/14 1610   Gross per 24 hour   Intake   2160 ml   Output   1950 ml   Net    210 ml     EXAM  Alert and oriented  Splint is clean/dry/intact  Operative lower extremity:                 FHL/EHL intact motor                Sensation is decreased to light touch in the toes (due to block)                Capillary refill is less than 2 seconds      Assessment: s/p Procedure(s) (LRB):  ARTHROSCOPY, ANKLE (Left)  STABILIZATION, ANKLE, BROSTROM PROCEDURE (Left)  ARTHROPLASTY, ANKLE (Left)    Plan:   Mobility: Out of bed as tolerated with PT/OT   Pain control: Continue to wean/titrate to appropriate oral regimen   DVT Prophylaxis (per Ortho Trauma service Protocol): Aspirin twice a day   Foley catheter status: Does not have Foley   Further surgical plans: No further Orthopaedic plans      RUE: WBAT   LUE:  WBAT   RLE:  WBAT   LLE:  NWB   Disposition: OK for discharge per Orthopaedics, will [place] today    F/U in one week    Wilford Sports, MD

## 2014-03-31 NOTE — Plan of Care (Signed)
Problem: Safety  Goal: Patient will be free from injury during hospitalization  Outcome: Progressing  Patient will be free from injury/falls during this hospital stay. Call light within reach, hourly rounding, bed alarm on    Problem: Pain  Goal: Patient's pain/discomfort is manageable  Outcome: Progressing  Patient pain managed with Roxicodone, and Oxycodone. Continue to assess pain level    Problem: Psychosocial and Spiritual Needs  Goal: Demonstrates ability to cope with hospitalization/illness  Outcome: Progressing  Family at bedside providing emotional support.

## 2014-03-31 NOTE — Discharge Summary (Signed)
Orthopaedic assessment:  S/P LEFT ankle scope with Brostrom, tibial chielectomy, OATs procedure           HPI    Ricky Huffman is a 43 y.o. year old male is S/P LEFT ankle scope with Toniann Ket, tibial chielectomy, OATs procedure    Past Medical and Surgical History      Past Medical History   Diagnosis Date   . Sleep apnea      has o2 by bedside    . Asthma without status asthmaticus      rare use of rescue inhaler will bring   . Heartburn      prilosec   . Difficulty in walking(719.7)    . Ear, nose and throat disorder      seasonal allergies   . Anxiety      due to sedation   . Claustrophobia         Past Surgical History   Procedure Date   . Rotator cuff repair    . Hernia repair      inguinal       Past Social History     History     Social History   . Marital Status: Married     Spouse Name: N/A     Number of Children: N/A   . Years of Education: N/A     Social History Main Topics   . Smoking status: Never Smoker    . Smokeless tobacco: Not on file   . Alcohol Use: Yes      Comment: moderate   . Drug Use: No   . Sexually Active:      Other Topics Concern   . Not on file     Social History Narrative   . No narrative on file       Family History   History reviewed. No pertinent family history.  Reviewed and not contributory to this presentation    Review of Systems:   All other systems were reviewed and are negative except: left ankle pain            Home Medications     Prior to Admission medications    Medication Sig Start Date End Date Taking? Authorizing Provider   ALBUTEROL IN Inhale 1 application into the lungs as needed.   Yes [provider]   amLODIPine (NORVASC) 10 MG tablet Take 10 mg by mouth daily.   Yes [provider]       Allergies     Allergies   Allergen Reactions   . Midazolam Anxiety   . Nsaids Swelling   . Toradol (Ketorolac Tromethamine) Swelling       Radiology Studies:   N/A                                                                        SURGERIES     1 Day  Post-Op S/PProcedure(s) (LRB):  ARTHROSCOPY, ANKLE (Left)  STABILIZATION, ANKLE, BROSTROM PROCEDURE (Left)  ARTHROPLASTY, ANKLE (Left) Plan:   Mobility: Out of bed as tolerated with PT/OT   Further surgical plans: No further Orthopaedic plans      RUE: WBAT   LUE:  WBAT   RLE:  WBAT   LLE:  NWB   Disposition: Stable from Orthopaedic perspective                 Meds:  Scheduled Meds:  Current Facility-Administered Medications   Medication Dose Route Frequency   . amLODIPine  10 mg Oral Daily   . aspirin EC  325 mg Oral QAM   . calcium citrate-Vitamin D  2 tablet Oral QAM   . [COMPLETED] ceFAZolin  1 g Intravenous Once   . ceFAZolin  2 g Intravenous Q8H   . enoxaparin  40 mg Subcutaneous QAM   . LORazepam  1 mg Oral Daily   . [COMPLETED] midazolam  2 mg Intravenous Once   . oxyCODONE  10 mg Oral Q12H SCH   . [COMPLETED] promethazine  6.25 mg Intravenous Once   . [DISCONTINUED] midazolam  2 mg Intravenous Once   . [DISCONTINUED] ondansetron  4 mg Oral Q8H SCH     Continuous Infusions:     . lactated ringers 100 mL/hr at 03/30/14 1737   . [DISCONTINUED] sodium chloride     . [DISCONTINUED] lactated ringers 20 mL/hr at 03/30/14 1157     PRN Meds:.albuterol, HYDROmorphone, naloxone, ondansetron, oxyCODONE, oxyCODONE, oxyCODONE, [DISCONTINUED] bupivacaine-EPINEPHrine (PF), [DISCONTINUED] fentaNYL, [DISCONTINUED] HYDROmorphone, [DISCONTINUED] meperidine, [DISCONTINUED] ondansetron, [DISCONTINUED] oxyCODONE-acetaminophen, [DISCONTINUED] promethazine, [DISCONTINUED] sodium chloride, [DISCONTINUED] sodium chloride irrigation

## 2014-03-31 NOTE — Progress Notes (Signed)
Pt interviewed and denies any anesthestic complications.     Post-op Day 1    Date:  03/31/2014 Time:  8:11 AM      Visit Type:  Inpatient Room #   [phone]    Subjective:  No complaints    Objective:  Pain Score  1                    Motor Block  Yes - starting to move toes slightly                    Sensory Block  Yes - still feeling completely numb at this time                    Catheter site  N/A    Assessment:  Surgical pain controlled    Plan:  No further management    Would pt receive block again?  Yes

## 2014-03-31 NOTE — Progress Notes (Signed)
Home Health Referral          Referral from Max Fickle (Case Manager) for home health care upon discharge.    By [employer], the patient has the right to freely choose a home care provider.  Arrangements have been made with:     A company of the patients choosing. We have supplied the patient with a listing of providers in your area who asked to be included and participate in Medicare.   [facility], a home care agency that provides both adult home care services which is a wholly owned and operated by [employer] and participates in [employer]   The preferred provider of your insurance company. Choosing a home care provider other than your insurance company's preferred provider may affect your insurance coverage.    The Home Health Care Referral Form acknowledging the voluntary selection of the home care company has been completed, signed, and is on file.      Home Health Discharge Information        [employer]:  Name of DME Company: [id number] [phone]  Equipment Ordered:  Bedside commode           The above services were set up by:                            Clyda Hurdle                           [facility] Liaison)   Phone   [phone]                                          Additional comments: bedside commode delivered to patients room      Signed by: Clyda Hurdle  Date Time: 03/31/2014 11:48 AM

## 2014-03-31 NOTE — Plan of Care (Signed)
Problem: Moderate/High Fall Risk Score >/=15  Goal: Patient will remain free of falls  Outcome: Progressing  Patient reminded and encouraged to use call bell light when assistance needed. Telephone and call light kept within reach. Bed low and locked. Bed alarm activated. Family member present and at bedside.     Problem: Pain  Goal: Patient's pain/discomfort is manageable  Outcome: Progressing  Pain managed with prn Roxicodone. Outcome fair. Continue to assess.

## 2014-03-31 NOTE — PT Progress Note (Signed)
Physical Therapy Note    [facility]  Physical Therapy Attempt Note    Patient:  Ricky Huffman MRN#:  [id number]  Unit:  5NEW ORTHOPEDICS Room/Bed: V409/W119-14      Patient not seen secondary to he declined PT session due to pain.Verbal review of stair climbing, crutches, BSC issued, has RW. Will see in am if not discharged tonite. Pt can go to condo s steps.Tiburcio Pea, PT  RN notified.

## 2014-03-31 NOTE — Op Note (Signed)
Procedure Date: 03/30/2014     Patient Type: A     SURGEON: Mcarthur Rossetti Jessicalynn Deshong DO  ASSISTANT:       ASSISTANTS:  1.  Dr. Malva Cogan  2.  Josie Hyams     PREOPERATIVE DIAGNOSES:  1.  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  2.  Left ankle instability.  3.  Left ankle synovitis.  4.  Left tibial exostosis.     POSTOPERATIVE DIAGNOSES:  1.  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  2.  Left ankle instability.  3.  Left ankle synovitis.  4.  Left tibial exostosis.     TITLE OF PROCEDURE:  1.  Left lateral talar OATS (osteochondral autograft transfer system)  procedure with fresh talar allograft.  2.  Left ankle arthroscopy with extensive debridement.  3.  Left open Brostrom-Gould procedure.  4.  Left partial excision tibia.  5.  Bone marrow aspiration concentrate.  6.  Use of intraoperative fluoroscopy for diagnostic purposes.       ANESTHESIA:  General with block.     ESTIMATED BLOOD LOSS:  20 mL.     COMPLICATIONS:  None.     CONDITION:  Stable to PACU.     OPERATIVE INDICATIONS:  This is a 43 year old male with a history of left chronic ankle instability  and a large cystic osteochondral lesion of the talus.  He has failed  extensive nonoperative treatment, therefore the above operative  intervention was recommended.  Due to the large cystic in nature of his  osteochondral lesion, an OATS procedure was felt to give the patient the  best chance of return to normal function.  The procedure, the risks,  benefits, alternatives, expected outcomes were all discussed with patient  at great length.  No guarantees were given.  The patient consented to the  above procedure.     DESCRIPTION OF PROCEDURE:  The patient was seen in preoperative area, operative extremity identified  and marked.  Preoperative antibiotics were given.  Regional block  administered by anesthesia team.  He was brought back to the operative  suite where he was placed on well-padded operating table in the  supine  position.  General anesthesia was administered.  A well-padded tourniquet  placed on the left thigh, but not yet inflated.  He was placed in the  sloppy lateral recumbent position with the left side facing up.  All bony  prominences were well padded.     Then, a surgical time out performed, verifying the patient name, extremity,  and procedure to be performed.     I first began with the bone marrow aspiration concentrate.  A Jamshidi  needle was placed percutaneously 2 inches proximal to the ASIS into the  iliac crest.  Approximately 30 mL of bone marrow was aspirated from the  iliac crest.  This was spun down to achieve bone marrow aspiration  concentrate.  This was later injected into the site of the osteochondral  allograft transplant later in the case.  A sterile dressing was then  applied.     Then, the left lower extremity was placed into a well-padded leg holder  with the knee flexed 60 degrees, foot 4 inches off the bed.  Left lower  extremity then prepped and draped in the usual sterile fashion.     We then began with the ankle arthroscopy portion of the case.  A standard  noninvasive ankle distraction was applied.  Then, standard anterolateral  and anteromedial portals were utilized.  Initially, a small nick incision  made just medial to the tibialis anterior tendon through the skin only.   Blunt dissection down through subcutaneous tissues down to the capsule and  through the capsule with a hemostat.  The blunt trocar with the  arthroscopic cannula was then placed.  After intraarticular placement, the  trocar removed, then the arthroscope was then placed.  Next, the  anterolateral portal was established under direct arthroscopic  visualization.  A small nick incision made just lateral to the peroneus  tertius tendon through the skin only.  Blunt dissection through  subcutaneous tissues down to the capsule and through the capsule with the  hemostat.  The arthroscopic shaver was then placed.  A  diagnostic  arthroscopy was initially performed.  There was moderate synovitis present  throughout the ankle joint.  An anterolateral impingement lesion was  present and this was debrided with the arthroscopic shaver.  There was  moderate synovitic tissue and fibrosis present medially as well that was  carefully debrided with the arthroscopic shaver.  A small flap of deltoid  was present within the medial gutter that was debrided, clearing up the  medial gutter.  The syndesmosis was inspected, appeared to be intact as  well.  Any remaining synovitic tissue was debrided.  The articular surface  was then inspected.  The tibial plafond articular surface seemed to be  pristine, no evidence of softening or osteochondral lesions noted.  And at  the tibial plafond.  The central medial portion of the talus that was  inspected appeared to have good smooth cartilage; however, there was a  massive flap of cartilage present along the lateral talus.  This was a  shoulder lesion.  It was extended just over 2 cm in the anterior posterior  dimension and just under 1 cm in width.  The flap was loose and  uncontained.  There were 2 small pieces of free cartilage floating around  within the joint, which were debrided.  Due to the large nature of the  problem, we then proceeded with the OATS procedure.     After all arthroscopic instrumentation was removed, the leg was removed  from the leg holder while maintaining sterile conditions.  A sterile  Esmarch was used to exsanguinate the extremity, and tourniquet was  inflated.  An incision was made beginning just proximal and over the  anterior aspect of the distal fibula and extending in a distal and  anteriorward direction over the anterolateral capsule.  Sharp and blunt  dissection to subcutaneous tissues, cauterizing all bleeders along the way.   Protection of the superficial peroneal nerve branches was performed.   Next, the anterior and lateral capsule was identified.  The ligaments  were  significantly attenuated.  An arthrotomy was then performed along the  anterior and inferior portion of the fibula, exposing the lateral talar  dome.  This exposed the osteochondral lesion quite well.  The lesion did  extend rather posteriorly; however..     In order to obtain better visualization of the posterior aspect of the  lesion, a plafond-plasty was performed.  A quarter-inch osteotome was used  to resect the small anterior surface of the anterior and lateral distal  tibia, being sure to resect a minimal articular cartilage of the plafond.   This improved visualization quite well.       Next, the Hintermann distractor was used to distract the joint to improve  visualization.  This provided  excellent perpendicular axis to the  osteochondral lesion.  The lesion was measured and measured approximately  22 mm in the AP direction and approximately 8 mm in the medial lateral  direction.  Then prepared to perform the OATS procedure.  Initially, an  8-mm plug of the fresh talar allograft was utilized.  Therefore, the  recipient talus was prepared.  The cartilage flap was excised.  There was a  large cystic defect present.  I then prepared to place the initial  osteochondral autograft plug.  The initial one was placed posteriorly and  guidewire was placed perpendicular to the OCD and then over drilled with  the appropriately sized reamer.  I then harvested the corresponding 8-mm  osteochondral allograft plug from the donor talus.  This was bathed in the  bone marrow aspirate concentrate and then implanted into the recipient  talus, ensuring that no edges were proud or prominent.  Because of the  size, a second 8-mm plug was chosen.  Again, a guidewire placed initially  perpendicular to lesion.  The corresponding sized reamer was placed over  the guidewire, reamed to the appropriately sized diameter and depth.  An  osteochondral autograft plug was then harvested, and after being bathed in  the bone marrow  aspiration concentrate, it was impacted into place just  anterior to the initial plug, again ensuring it was not prominent and was  flush with the articular surface.  Finally, the most anterior portion of  the lesion was prepared, again guidewire placed perpendicular to into the  OCD, perpendicular to lesion.  The corresponding reamer then was utilized  to ream over the guidewire to the appropriate diameter and depth.  Then a  6-mm plug of allograft was harvested.  This was again bathed in bone marrow  aspiration concentrate and impacted into place.  There was an 8-mm plug  posterior and centrally, and a 6-mm plug anteriorly.  These all seemed to  have good press-fit and were not prominent or proud.     Intraoperative fluoroscopy was then utilized to ensure that there were no  free fragments or areas of bony prominence and this did confirm this.     I then proceeded to the [place] portion of the case.  Multiple #0  FiberWire were then placed to repair the attenuated lateral collateral  ligaments and these were placed in a pants-over-vest fashion with the foot  held in a dorsiflexed and everted position.  To reinforce the repair, the  inferior extensor retinaculum was then sutured to the imbricated capsule  with multiple #0 Vicryl sutures.  This afforded excellent stability and  repair.  The wound was then irrigated with a large amount of sterile saline  and was closed in layers using 0 Vicryl, 2-0 Vicryl, and Monocryl for the  skin, a sterile dressing applied, using Xeroform, 4 x 4's, ABDs and Webril.   The patient placed in a bulky Jones dressing with posterior splint.   Tourniquet released.  Toes pinked up nicely.  He was awakened and  transferred to PACU in stable condition and tolerated the procedure well.     The patient will be admitted postoperatively for observation, for pain  control, IV antibiotics, and deep venous thrombosis prophylaxis.  He will  be nonweightbearing of his left lower extremity.  He will  follow up with me  in one week's time for a recheck.  At that time, he will be placed between  nonweightbearing cast.  We will likely place him  into a boot at about  between 4 to 6 weeks postoperatively depending on healing, and begin  weightbearing formal physical therapy at 6 to 8 weeks postoperatively.       Prognosis for this patient is certainly guarded.  He had a massive  osteochondral lesion of the talus.  He is certainly at risk for  degenerative changes of the ankle in the future.     The assistance of an Designer, television/film set and assistant were required  throughout the case for retraction of important neurovascular structures,  assistance with the arthroscopy talar allograft harvest and implantation,  and other key components of the case.  The case cannot be done without  their skilled hands.           D:  03/30/2014 17:07 PM by Dr. Mcarthur Rossetti. Abdiel Blackerby, DO [phone])  T:  03/31/2014 08:54 AM by       Everlean Cherry: [id number]) (Doc ID: [id number])

## 2014-04-01 ENCOUNTER — Other Ambulatory Visit: Payer: Self-pay

## 2014-04-01 MED ORDER — ACETAMINOPHEN 325 MG PO TABS
650.0000 mg | ORAL_TABLET | ORAL | Status: DC | PRN
Start: 2014-04-01 — End: 2014-04-01
  Administered 2014-04-01 (×2): 650 mg via ORAL
  Filled 2014-04-01 (×2): qty 2

## 2014-04-01 MED ORDER — ACETAMINOPHEN 325 MG PO TABS
650.0000 mg | ORAL_TABLET | ORAL | Status: DC | PRN
Start: 2014-04-01 — End: 2017-05-13

## 2014-04-01 MED ORDER — HYDROMORPHONE HCL 4 MG PO TABS
4.0000 mg | ORAL_TABLET | ORAL | Status: DC | PRN
Start: 2014-04-01 — End: 2014-04-01

## 2014-04-01 MED ORDER — OXYCODONE HCL ER 20 MG PO T12A
20.0000 mg | EXTENDED_RELEASE_TABLET | Freq: Two times a day (BID) | ORAL | Status: DC
Start: 2014-04-01 — End: 2014-04-01
  Administered 2014-04-01: 20 mg via ORAL
  Filled 2014-04-01: qty 1

## 2014-04-01 MED ORDER — OXYCODONE HCL ER 20 MG PO T12A
20.0000 mg | EXTENDED_RELEASE_TABLET | Freq: Two times a day (BID) | ORAL | 0 refills | Status: DC
Start: 2014-04-01 — End: 2014-04-01
  Filled 2014-04-01: qty 20, 10d supply, fill #0

## 2014-04-01 MED ORDER — HYDROMORPHONE HCL 4 MG PO TABS
4.0000 mg | ORAL_TABLET | ORAL | 0 refills | Status: DC | PRN
Start: 2014-04-01 — End: 2017-05-13
  Filled 2014-04-01: qty 50, 7d supply, fill #0

## 2014-04-01 NOTE — Plan of Care (Signed)
Problem: Moderate/High Fall Risk Score >/=15  Goal: Patient will remain free of falls  Outcome: Progressing  Patient reminded and encouraged to use call bell light when assistance needed. Telephone and call light kept within reach. Bed low and locked. Bed alarm activated. Family member present and at bedside.     Problem: Pain  Goal: Patient's pain/discomfort is manageable  Outcome: Progressing  Pain managed with prn Dilaudid and scheduled Oxycontin. Outcome fair. Continue to assess pain level.

## 2014-04-01 NOTE — Discharge Summary -  Nursing (Signed)
Saline loc removed prior to discharge, pressure dressing applied. Discharge instructions reviewed with pt and wife. Prescriptions filled prior to discharge. Assisted pt in washing up prior to discharge, pt did well minimal assistance needed. Provided specific instructions for Dr. Lura Em as well as information about medications prescribed. Pt and wife verbalized understanding of all instructions given. Pt transported to lobby via wheelchair for discharge.

## 2014-04-01 NOTE — OT Progress Note (Signed)
Occupational Therapy Note    Per discussion with PT; pt declining therapy at this time. No further needs or concerns. Discontinue OT.     Synetta Fail, OT  04/01/2014  10:57 AM

## 2014-04-01 NOTE — Plan of Care (Signed)
Problem: Moderate/High Fall Risk Score >/=15  Goal: Patient will remain free of falls  Outcome: Progressing  Pt aware to call for assistance with mobility. Call bell and telephone placed within reach. Bed alarm activated. Hourly rounding    Problem: Pain  Goal: Patient's pain/discomfort is manageable  Outcome: Progressing  Pain managed with Oxycontin 20 mg BID (which was increased from 10 mg) and Dilaudid 4 mg PRN. Pt reports that this combination is working better to help manage his pain. Plan to discharge this evening on current regimen, pt agreeable to plan

## 2014-04-01 NOTE — PT Progress Note (Signed)
Physical Therapy Note    [facility]  Physical Therapy Treatment    Patient:  Ricky Huffman MRN#:  [id number]  Unit:  5NEW ORTHOPEDICS Room/Bed:  D664/Q034-74    Assessment:   After today's treatment patient able to ascend/descend 4 steps with B rails and I , NWBing L LE  Assessment: Gait impairment;Decreased functional mobility, resulting in continued difficulty with overall functional mobility.  Prognosis: Good    Interdisciplinary Communication:   Patient up in chair and call bell within reach. Communicated with RN regarding status.    Education:   Further education provided to patient, safety with mobility and ADLs.  Demonstrated good understanding with stairs.      Plan:      Plan  Treatment/Interventions: Gait training  PT Frequency: 4-5x/wk    Continue plan of care.    Recommendation  Discharge Recommendation: Home with supervision  DME Recommended for Discharge:  (pt has axillary crutches, RW and BSC)  PT Frequency: 4-5x/wk    Treatment:     Time of treatment: Start Time: 1600 Stop Time: 1615  Time Calculation (min): 15 min  PT Received On: 04/01/14    Treatment #      Precautions  Weight Bearing Status: LLE non weight bearing    Patient's medical condition is appropriate for Physical Therapy intervention at this time.     Subjective: "If you need me to do the stairs then I will do the stairs.  Patient is agreeable to participation in the therapy session. Nursing clears patient for therapy.                 Objective:  Patient is seated in a cardiac chair with no medical equipment in place.    Cognition  Orientation Level: Oriented X4  Functional Mobility  Sit to Stand: Independent  Stand to Sit: Independent     Locomotion  Stair Management: two rails;independently (NWB LLE)  Number of Stairs: 4      Pt declines any further use of stairs, due to lightheadedness.         Signature: Marinus Maw, PT

## 2014-04-01 NOTE — Progress Notes (Signed)
Pt seen/exam.  Pain currently not well controlled on po meds.  No other c/o.  No CP, SOB.  VSS, afebrile    LEFT LE: splint c/d/i; toes pink/warm, CR<2sec.  Calves supple b/l.  Actively flexes/extends toes.    A/P:   1. S/p LEFT ankle OATs procedure- pod#2  - PT/OT- NWB LLE  - Pain control  - DVT proph-   - d/c home when pain controlled on po meds

## 2014-04-01 NOTE — Discharge Instructions (Signed)
Dr. Barnett Hatter Instructions  Keep dressing clean, dry and intact  Non weight bearing to left lower extremity  Ice as needed  Keep left lower extremity elevated  Do NOT take Oxycontin, Dilaudid, and Roxicodone (Oxycodone) together. Per Dr. Lura Em - patient may take Oxycontin and Dilaudid OR Oxycontin and Roxicodone, whichever is controlling patients pain better!    Postsurgery Checklist  After surgery, you can recover quickly and safely. You may have been an inpatient (staying in the hospital overnight) or an outpatient (going home the day of surgery). In the facility, follow instructions from your surgeon, nurse, and other healthcare providers. Work with them so that your stay will go smoothly. This will allow you to go home as scheduled. At home, help your body heal by staying comfortable, taking care of your incision, eating well, and resting. Also, know when to call your healthcare provider if a problem arises.  Use the guidelines below to remind yourself what to do after surgery. Be sure to follow any specific post-op instructions from your surgeon or nurse.     Follow-up care is an important part of a successful surgery. Your doctor needs to see that you're healing and recovering well. Ask when to return for your first follow-up visit.      At Home   Pain management. Take your pain medications as directed, before the pain becomes severe. Don't take medication more often (overdose is dangerous) or less often (which is less effective). Taking pain medication at night can help you get a good night's rest.   Preventing DVT and pneumonia. DVT and pneumonia can still occur even after you go home. Follow the discharge instructions you were given in the hospital. For preventing DVT, this may include continuing to wear compression stockings, using a compression device, or taking medication. For preventing pneumonia, this may include keeping up with the breathing and coughing exercises you learned in the  hospital.   Incision care. Keep the incision dry. Do not remove dressing. Watch for signs of infection. Signs include increased redness, swelling, smelly discharge, or drainage from the incision. Keep your incision elevated above heart level, if you've been told to do so. If you have a drainage tube, care for it as instructed.   Activity. At your follow-up visit, ask your doctor when it's safe to return to your regular level of activity. Work up to your former level of exercise slowly so you don't get overtired or stress your surgical site.    Weight bearing Status: Do not put weight on foot, use crutches or a walker.   Returning to work. Going back to work depends on your surgery and the type of job you do. Your surgeon will decide when you can return to work. It's often 4 to 6 weeks after major surgery, and a few days after minor surgery. Wait 7 days before you return to work unless otherwise directed by your physician.   Driving. Do not drive until your surgeon tells you that you can. This is because some medications, such as those for pain, can cause you to be drowsy.   Get good nutrition. Healthy eating helps your body heal. But you may have some nausea after surgery. So try to eat what seems good to you. If you have a special diet before surgery, ask your doctor if you should follow it while you recover.  Follow-Up Appointment:   Within 7-10 days of surgery    __________________________________________________________    When to Seek Medical Attention  Call 911 right away if you have any of the following:   Chest pain   Shortness of breath   Pain, swelling, redness, or warmth in the calf or thigh  Otherwise, call your surgeon immediately if you have any of the following:   Increased pain or pain that is not relieved with medications   Drainage, redness, or swelling around the incision   Incision opens up (cover it with a clean dressing or cloth)   Drainage tube falls  out (do not try to put it back in)   Severe nausea and vomiting   Fever of 101F or higher   Post-Op Tips: Foot  At home, you play a major role in your recovery. The better you care for your foot, the faster you may heal. And the faster you heal, the sooner you can resume your normal activities. Follow your doctor's instructions about how to care for your foot after surgery.     A plastic bag over your foot taped securely to your leg helps keep your dressing or cast dry when bathing.     Relieving Pain   Take pain medication to relieve discomfort and antibiotics to prevent infection as you're instructed.   To protect your foot, wear your surgical (post-op) shoe as recommended.   To relieve pain and swelling, apply a bag of ice to your foot as suggested and elevate your foot above heart level the first few days.  Strengthening Your Foot  Your doctor may suggest ways to get your foot back in shape. You may be instructed to do exercises, possibly using weights. To help strengthen your foot, follow these tips:   Start out slowly, then increase repetitions as recommended.   Physical therapy may also be recommended to help you strengthen your foot and increase its range of motion.  Follow Up with Your Doctor  You may visit your doctor the first week or two after surgery. He or she will remove your dressing, assess your incision, and apply a fresh dressing. Your sutures and your cast, if you have one, may be removed in 2 or more weeks. To make sure you're healing properly, you may see your surgeon regularly for 3 months.         Home Health Discharge Information        [employer]:  Name of DME Company: [employer] [id number]  Equipment Ordered:  Bedside commode           The above services were set up by:                            Clyda Hurdle                           [facility] Liaison)   Phone   [phone]         Medication Education      MEDICATION: ENOXAPARIN  (Injection)  Enoxaparin (brand: Lovenox) is used to "thin" the blood and prevent new or continued blood clotting within the blood vessels. This drug is used to treat blood clots in the deep veins of the legs. It is also used in certain conditions to prevent blood clots from forming.  DIRECTIONS FOR USE:   This is an injectable medicine. Your healthcare provider will teach you how to give yourself injections. Your doctor will tell you how much and how often to use the  medicine.   If you miss a dose of this medicine, take it as soon as possible. If it is almost time for your next dose, skip the missed dose and resume your regular schedule. Do not take a double dose to make up for a missed dose.   For the safety of others, put used syringes in a puncture-resistant container, or dispose of them as directed.  WHAT TO WATCH FOR:  POSSIBLE SIDE EFFECTS: Mild injection site irritation/pain, bruising, fatigue, fever, nausea (Contact your doctor if these symptoms are severe or persist more than a few days). Signs of excess bleeding: increased bruising, prolonged bleeding from cuts, bleeding from the nose or gums, blood in urine, vomit or stools (red or black color), coughing up blood or unusually heavy menstrual bleeding, unusual pain, dark urine, severe headache, dizziness or fainting, seizures (Call your doctor promptly or return to this facility).  ALLERGIC REACTIONS: Rash, itching, swelling, trouble swallowing or breathing (Contact your doctor or return to this facility promptly).  MEDICAL CONDITIONS: Before starting this medicine, be sure your doctor knows if you have any of the following conditions:   Pregnancy, breastfeeding, kidney or liver disease, blood disease or bleeding problems, active stomach ulcer, high blood pressure, heart valves, threatened miscarriage, past stroke, spine problems, certain eye problems (retinopathy)   Recent childbirth, head injury or blow to the body, or surgery or dental  procedure   Allergy to heparin or pork  DRUG INTERACTIONS: Before starting this medicine, be sure your doctor knows if you are taking any of the following drugs:   Nonsteroidal anti-inflammatory drugs [ibuprofen (Advil, Motrin), naproxen (Naprosyn, Aleve), keterolac (Toradol), and others], aspirin   Anti-platelet drugs [clopidogrel (Plavix), ticlopidine (Ticlid), dipyridamole (Persantine), Aggrenox]   Aspirin/salicylate   Herbal supplements   Potassium supplements   Drugs that may increase your potassium level (e.g., spironolactone, ACE inhibitors)   Drotrecogin alfa  WARNING:   Notify your doctor before having spinal anesthesia or a lumbar puncture (spinal tap) while you are taking enoxaparin. You will be at increased risk of bleeding around the spinal cord and paralysis.   Before having surgery or a dental procedure, notify your doctor that you are taking this drug.  [NOTE: This information topic may not include all directions, precautions, medical conditions, drug/food interactions, and warnings for this drug. Check with your doctor, nurse, or pharmacist for any questions that you may have.]   [address], [place], [place] [address]. All rights reserved. This information is not intended as a substitute for professional medical care. Always follow your healthcare professional's instructions.    MEDICATION: OXYCODONE, sustained release  Oxycodone (brand names: Oxycontin, Percolone, Roxicodone) is a sustained release narcotic pain medicine. Be sure to take it only as directed.  DIRECTIONS FOR USE:  Take with a full glass of water. If this medicine makes your stomach upset, take it with food. Pain medicine should be taken only if needed at the times prescribed. If you are not having pain, do not take the medicine unless you are advised to do so by your doctor. Do not crush, chew or break the sustained release tablets.  WHAT TO WATCH FOR  POSSIBLE SIDE EFFECTS: Dizziness,  drowsiness (Reduce dose or take it less often). Constipation (Drink lots of liquids, use small doses of a mild laxative like Milk of Magnesia as needed). Nausea, vomiting (Take the medicine with food). Difficulty passing urine (Stop the medicine and contact your doctor). Slowed breathing (less than 8 breaths per  minute), loss of consciousness (Call 911 or go to the Emergency Department).  ALLERGIC REACTION: Rash, itching, swelling, trouble breathing or swallowing (Contact your doctor or return to this facility promptly).  MEDICAL CONDITIONS: Before starting this medicine, be sure your doctor knows if you have any of the following conditions:  -- Chronic alcoholism or history of drug abuse  -- Kidney or liver disease, asthma, prostate enlargement or trouble passing urine, hypothyroid, seizures, gallbladder disease  -- Pregnancy or breast feeding  DRUG INTERACTIONS: Before starting this medicine, be sure your doctor knows if you are taking any of the following medicines:  -- Medicine for sleep, anxiety  -- Tricyclic antidepressants [Elavil (amitriptyline) and others], narcotic pain medicines  -- Phenothiazines [Thorazine (chlorpromazine), and others], antihistamines  WARNINGS:  -- DO NOT DRIVE, ride a bicycle or operate dangerous equipment while taking this medicine until you know how this drug will affect you.  -- This drug may cause increased side effects when taken with alcohol, muscle relaxant, sedative, anxiety medicine, antihistamines, antidepressants, MAO-inhibitor, seizure medicine, or another pain medicine.  -- Prolonged use of this medicine can be HABIT FORMING and may lead to ADDICTION.  [NOTE: This information topic may not include all directions, precautions, medical conditions, drug/food interactions and warnings for this drug. Check with your doctor, nurse or pharmacist for any questions that you may have.]   [address], [place], [place] [address]. All rights reserved.  This information is not intended as a substitute for professional medical care. Always follow your healthcare professional's instructions.    Oxycodone Hydrochloride Oral tablet  What is this medicine?  OXYCODONE (ox i KOE done) is a pain reliever. It is used to treat moderate to severe pain.  This medicine may be used for other purposes; ask your health care provider or pharmacist if you have questions.  What should I tell my health care provider before I take this medicine?  They need to know if you have any of these conditions:   Addison's disease   brain tumor   drug abuse or addiction   head injury   heart disease   if you frequently drink alcohol containing drinks   kidney disease or problems going to the bathroom   liver disease   lung disease, asthma, or breathing problems   mental problems    an unusual or allergic reaction to oxycodone, codeine, hydrocodone, morphine, other medicines, foods, dyes, or preservatives   pregnant or trying to get pregnant   breast-feeding  How should I use this medicine?  Take this medicine by mouth with a glass of water. Follow the directions on the prescription label. You can take it with or without food. If it upsets your stomach, take it with food. Take your medicine at regular intervals. Do not take it more often than directed. Do not stop taking except on your doctor's advice.  Some brands of this medicine, like Oxecta, have special instructions. Ask your doctor or pharmacist if these directions are for you: Do not cut, crush or chew this medicine. Swallow only one tablet at a time. Do not wet, soak, or lick the tablet before you take it.  Talk to your pediatrician regarding the use of this medicine in children. Special care may be needed.  Overdosage: If you think you have taken too much of this medicine contact a poison control center or emergency room at once.  NOTE: This medicine is only for you. Do not share this medicine with  others.  What if I miss a  dose?  If you miss a dose, take it as soon as you can. If it is almost time for your next dose, take only that dose. Do not take double or extra doses.  What may interact with this medicine?   alcohol   antihistamines   certain medicines used for nausea like chlorpromazine, droperidol   erythromycin   ketoconazole   medicines for depression, anxiety, or psychotic disturbances   medicines for sleep   muscle relaxants   naloxone   naltrexone   narcotic medicines (opiates) for pain   nilotinib   phenobarbital   phenytoin   rifampin   ritonavir   voriconazole  This list may not describe all possible interactions. Give your health care provider a list of all the medicines, herbs, non-prescription drugs, or dietary supplements you use. Also tell them if you smoke, drink alcohol, or use illegal drugs. Some items may interact with your medicine.  What should I watch for while using this medicine?  Tell your doctor or health care professional if your pain does not go away, if it gets worse, or if you have new or a different type of pain. You may develop tolerance to the medicine. Tolerance means that you will need a higher dose of the medicine for pain relief. Tolerance is normal and is expected if you take this medicine for a long time.  Do not suddenly stop taking your medicine because you may develop a severe reaction. Your body becomes used to the medicine. This does NOT mean you are addicted. Addiction is a behavior related to getting and using a drug for a non-medical reason. If you have pain, you have a medical reason to take pain medicine. Your doctor will tell you how much medicine to take. If your doctor wants you to stop the medicine, the dose will be slowly lowered over time to avoid any side effects.  You may get drowsy or dizzy when you first start taking this medicine or change doses. Do not drive, use machinery, or do anything that may be dangerous until you know how the medicine affects you.  Stand or sit up slowly.  There are different types of narcotic medicines (opiates) for pain. If you take more than one type at the same time, you may have more side effects. Give your health care provider a list of all medicines you use. Your doctor will tell you how much medicine to take. Do not take more medicine than directed. Call emergency for help if you have problems breathing.  This medicine will cause constipation. Try to have a bowel movement at least every 2 to 3 days. If you do not have a bowel movement for 3 days, call your doctor or health care professional.  Your mouth may get dry. Drinking water, chewing sugarless gum, or sucking on hard candy may help. See your dentist every 6 months.  What side effects may I notice from receiving this medicine?  Side effects that you should report to your doctor or health care professional as soon as possible:   allergic reactions like skin rash, itching or hives, swelling of the face, lips, or tongue   breathing problems   confusion   feeling faint or lightheaded, falls   trouble passing urine or change in the amount of urine   unusually weak or tired  Side effects that usually do not require medical attention (report to your doctor or health care professional if  they continue or are bothersome):   constipation   dry mouth   itching   nausea, vomiting   upset stomach  This list may not describe all possible side effects. Call your doctor for medical advice about side effects. You may report side effects to FDA at 1-800-FDA-1088.  Where should I keep my medicine?  Keep out of the reach of children. This medicine can be abused. Keep your medicine in a safe place to protect it from theft. Do not share this medicine with anyone. Selling or giving away this medicine is dangerous and against the law.  Store at room temperature between 15 and 30 degrees C (59 and 86 degrees F). Protect from light. Keep container tightly closed.  Discard unused medicine and  used packaging carefully. Pets and children can be harmed if they find used or lost packages. Flush any unused medicines down the toilet. Do not use the medicine after the expiration date.  NOTE: This sheet is a summary. It may not cover all possible information. If you have questions about this medicine, talk to your doctor, pharmacist, or health care provider.  NOTE:This sheet is a summary. It may not cover all possible information. If you have questions about this medicine, talk to your doctor, pharmacist, or health care provider. Copyright 2014 [employer]      MEDICATION: CEPHALEXIN   Cephalexin (brand: Keflex, Keftab) is a cephalosporin type of antibiotic.  DIRECTIONS FOR USE:  This medicine may be taken with or without food. If you find that it upsets your stomach, take it with food. Take the medicine at regular intervals. If the label says "every 6 hours," this means 4 times per day. Doses do not have to be exactly 6 hours apart, but you should take 4 doses a day. If the label says "every 8 hours," this means 3 times a day.  Take all of the medicine until it is gone, even if you are feeling better. This will ensure that the infection is fully treated.  WHAT TO WATCH FOR:  POSSIBLE SIDE EFFECTS: Nausea, loss of appetite, diarrhea, dizziness, headache (Contact your doctor if any of these symptoms persist or become severe). White spots in the mouth (thrush), vaginal itching or discharge, easy bruising or bleeding, yellowing of the eyes/skin, dark urine, change in the amount of urine, new signs of infection (Contact your doctor).  ALLERGIC REACTION: Rash, itching, swelling, trouble breathing or swallowing, weakness or fainting (Contact your doctor or return to this facility promptly).  MEDICAL CONDITIONS: Before starting this medicine, be sure your doctor knows if you have any of the following conditions:   Breastfeeding   Kidney disease   Stomach/intestinal conditions   Allergic reaction to any other  cephalosporin (Suprax, Ceftin and others) or penicillin-type of medicine (amoxicillin, ampicillin, dicloxacillin, and others)  DRUG INTERACTION: Before taking this drug, be sure your doctor knows if you are taking any of the following:   Probenecid   Metformin   Birth control pills   Live vaccines  WARNING:  Do not drive, ride a bicycle, or operate dangerous equipment while taking this medicine until you know how it will affect you.  [NOTE: This information topic may not include all directions, precautions, medical conditions, drug/food interactions, and warnings for this drug. Check with your doctor, nurse, or pharmacist for any questions that you may have.]   [address], [address] [place], [place] [address]. All rights reserved. This information is not intended as a substitute for  professional medical care. Always follow your healthcare professional's instructions.    MEDICATION: DOCUSATE  Docusate (brand names include Colace, Correctol, DSS and others) is used to treat constipation. It usually gives relief in 1-3 days.  DIRECTIONS FOR USE:  -- Take this medicine at bedtime. Capsules can be taken with an 8 ounce glass of water or juice. The liquid form can be added to 4-8 ounces of juice or milk or formula.  -- Decrease the dose or stop the medicine if you get diarrhea.   -- Use this medicine only when needed. Do not use for more than 1 week unless told otherwise.   WHAT TO WATCH FOR:  POSSIBLE SIDE EFFECTS: Stomach pain, diarrhea or cramping, throat irritation from the liquid form --> (This should go away within a few hours after a dose. If symptoms persist, notify your doctor).   ALLERGIC REACTIONS: Rash, itching, swelling, trouble swallowing or breathing --> (Contact your doctor or return to this facility promptly).  ---------- IMPORTANT ----------  MEDICAL CONDITIONS: Before starting this medicine, be sure your doctor knows if you have any of the following conditions:  -- Severe  abdominal pain or vomiting  -- Pregnancy or breast feeding  DRUG INTERACTIONS: Before starting this medicine, be sure your doctor knows if you are taking any of the following drugs:  -- Mineral Oil, aspirin  -- Laxatives containing phenolphthalein (such as Ex-Lax)  WARNINGS:  -- Notify your doctor if your condition worsens or fails to respond to this medicine.  [NOTE: This information topic may not include all directions, precautions, medical conditions, drug/food interactions and warnings for this drug. Check with your doctor, nurse or pharmacist for any questions that you may have.]   [address], [address], [place], [place] [address]. All rights reserved. This information is not intended as a substitute for professional medical care. Always follow your healthcare professional's instructions.      MEDICATION: DILAUDID  Dilaudid (generic name is Hydromorphone) is used to relieve moderate to severe pain.  DIRECTIONS FOR USE:  -- Take with food or milk to prevent upset stomach.  -- Pain medicine should be taken only if needed at the times prescribed. Unless told otherwise, do not take the medicine if you are not having pain.  WHAT TO WATCH FOR:  POSSIBLE SIDE EFFECTS: Dizziness, drowsiness (Take a smaller dose or take it less often). Constipation (Drink lots of liquids, use small doses of a mild laxative like Milk of Magnesia, as needed). Nausea, vomiting (Take with food and a full glass of water). Nervousness, tremor, anxiety (Contact your doctor). Fast or irregular heart beating, seizures, slowed breathing (less than 8 breaths per minute), loss of consciousness (Call 911 or go to the Emergency Department).  ALLERGIC REACTIONS: Rash, itching, swelling, trouble swallowing or breathing  (Contact your doctor or return to this facility promptly).  MEDICAL CONDITIONS: Before starting this medicine, be sure your doctor knows if you have any of the following conditions:  -- Severe asthma  -- History of  alcohol or drug dependence  -- Pregnancy or breast feeding  DRUG INTERACTIONS: Before starting this medicine, be sure your doctor knows if you are taking any of the following medicines:  -- Medicine for sleep, anxiety  -- Tricyclic antidepressants [Elavil (amitriptyline) and others], narcotic pain medicines  -- Phenothiazines [Thorazine (chlorpromazine), and others], antihistamines  WARNINGS:  -- DO NOT DRIVE, ride a bicycle or operate dangerous equipment while taking this medicine until you know how this drug will affect  you.  -- This drug may cause increased side effects when taken with alcohol, muscle relaxant, sedative, anxiety medicine, antihistamines, antidepressants, MAO-inhibitor, seizure medicine, or another pain medicine.  -- Prolonged use of this medicine can be HABIT FORMING and may lead to ADDICTION.  [NOTE: This information topic may not include all directions, precautions, medical conditions, drug/food interactions and warnings for this drug. Check with your doctor, nurse or pharmacist for any questions that you may have.]   [address], [place], [place] [address]. All rights reserved. This information is not intended as a substitute for professional medical care. Always follow your healthcare professional's instructions.

## 2014-04-01 NOTE — PT Progress Note (Signed)
Physical Therapy Note    Pt is declining therapy currently.  Pt states that he is comfortable ambulating with the RW and crutches.  Pt states that he has no needs currently.  Will check on pt's status prior to d/c.  Marinus Maw, PT

## 2014-04-01 NOTE — Progress Notes (Signed)
Patient ambulated 100 feet with walker in hallway independently with standby assist. Tolerated well. No signs or symptoms of acute distress observed.

## 2014-04-06 ENCOUNTER — Encounter: Payer: Self-pay | Admitting: Foot and Ankle Surgery

## 2014-04-14 ENCOUNTER — Other Ambulatory Visit: Payer: Self-pay

## 2014-04-14 MED ORDER — OXYCODONE HCL ER 30 MG PO T12A
30.0000 mg | EXTENDED_RELEASE_TABLET | Freq: Two times a day (BID) | ORAL | 0 refills | Status: DC | PRN
Start: 2014-04-14 — End: 2017-06-02
  Filled 2014-04-14: qty 14, 7d supply, fill #0

## 2014-04-14 MED ORDER — OXYCODONE HCL 5 MG PO TABS
5.0000 mg | ORAL_TABLET | ORAL | 0 refills | Status: DC | PRN
Start: 2014-04-14 — End: 2017-05-13
  Filled 2014-04-14: qty 60, 5d supply, fill #0

## 2014-05-01 ENCOUNTER — Other Ambulatory Visit: Payer: Self-pay

## 2014-05-01 MED ORDER — OXYCODONE HCL ER 20 MG PO T12A
EXTENDED_RELEASE_TABLET | ORAL | 0 refills | Status: DC
Start: 2014-05-01 — End: 2017-05-13
  Filled 2014-05-01: qty 20, 10d supply, fill #0

## 2014-05-01 MED ORDER — OXYCODONE HCL 5 MG PO TABS
ORAL_TABLET | ORAL | 0 refills | Status: DC
Start: 2014-05-01 — End: 2017-05-13
  Filled 2014-05-01: qty 60, 7d supply, fill #0

## 2014-05-01 MED ORDER — ONDANSETRON 4 MG PO TBDP
ORAL_TABLET | ORAL | 1 refills | Status: DC
Start: 2014-05-01 — End: 2017-05-13
  Filled 2014-05-01: qty 12, 3d supply, fill #0

## 2017-05-13 ENCOUNTER — Emergency Department: Payer: Commercial Managed Care - PPO

## 2017-05-13 ENCOUNTER — Emergency Department
Admission: EM | Admit: 2017-05-13 | Discharge: 2017-05-13 | Disposition: A | Discharge status: Home / Self Care | Payer: Commercial Managed Care - PPO | Attending: Emergency Medicine | Admitting: Emergency Medicine

## 2017-05-13 DIAGNOSIS — R079 Chest pain, unspecified: Secondary | ICD-10-CM

## 2017-05-13 DIAGNOSIS — I10 Essential (primary) hypertension: Secondary | ICD-10-CM | POA: Insufficient documentation

## 2017-05-13 DIAGNOSIS — J45909 Unspecified asthma, uncomplicated: Secondary | ICD-10-CM | POA: Insufficient documentation

## 2017-05-13 DIAGNOSIS — Z7951 Long term (current) use of inhaled steroids: Secondary | ICD-10-CM | POA: Insufficient documentation

## 2017-05-13 DIAGNOSIS — Z79899 Other long term (current) drug therapy: Secondary | ICD-10-CM | POA: Insufficient documentation

## 2017-05-13 LAB — COMPREHENSIVE METABOLIC PANEL
ALT: 30 U/L (ref 0–55)
AST (SGOT): 21 U/L (ref 5–34)
Albumin/Globulin Ratio: 1.3 (ref 0.9–2.2)
Albumin: 3.8 g/dL (ref 3.5–5.0)
Alkaline Phosphatase: 70 U/L (ref 38–106)
Anion Gap: 11 (ref 5.0–15.0)
BUN: 22 mg/dL (ref 9.0–28.0)
Bilirubin, Total: 0.6 mg/dL (ref 0.2–1.2)
CO2: 25 mEq/L (ref 22–29)
Calcium: 9 mg/dL (ref 8.5–10.5)
Chloride: 102 mEq/L (ref 100–111)
Creatinine: 1.4 mg/dL — ABNORMAL HIGH (ref 0.7–1.3)
Globulin: 3 g/dL (ref 2.0–3.6)
Glucose: 104 mg/dL — ABNORMAL HIGH (ref 70–100)
Potassium: 4.4 mEq/L (ref 3.5–5.1)
Protein, Total: 6.8 g/dL (ref 6.0–8.3)
Sodium: 138 mEq/L (ref 136–145)

## 2017-05-13 LAB — LIPASE: Lipase: 60 U/L (ref 8–78)

## 2017-05-13 LAB — CBC AND DIFFERENTIAL
Basophils Automated: 0 %
Eosinophils Automated: 4 %
Hematocrit: 47.1 % (ref 42.0–52.0)
Hgb: 15.7 g/dL (ref 13.0–17.0)
Lymphocytes Automated: 30 %
MCH: 28.8 pg (ref 28.0–32.0)
MCHC: 33.3 g/dL (ref 32.0–36.0)
MCV: 86.3 fL (ref 80.0–100.0)
MPV: 9.7 fL (ref 9.4–12.3)
Monocytes: 8 %
Neutrophils: 53 %
Platelets: 211 10*3/uL (ref 140–400)
RBC: 5.46 10*6/uL (ref 4.70–6.00)
RDW: 14 % (ref 12–15)
WBC: 9.72 10*3/uL (ref 3.50–10.80)

## 2017-05-13 LAB — GFR: EGFR: >60

## 2017-05-13 LAB — ECG 12-LEAD
Atrial Rate: 63 {beats}/min
P Axis: 59 degrees
P-R Interval: 158 ms
Q-T Interval: 396 ms
QRS Duration: 76 ms
QTC Calculation (Bezet): 405 ms
R Axis: -2 degrees
T Axis: 5 degrees
Ventricular Rate: 63 {beats}/min

## 2017-05-13 LAB — I-STAT TROPONIN
i-STAT Troponin: 0.02 ng/mL (ref 0.00–0.09)
i-STAT Troponin: 0 ng/mL (ref 0.00–0.09)

## 2017-05-13 LAB — MAGNESIUM: Magnesium: 2.2 mg/dL (ref 1.6–2.6)

## 2017-05-13 MED ORDER — ALUM & MAG HYDROXIDE-SIMETH 200-200-20 MG/5ML PO SUSP
30.0000 mL | Freq: Once | ORAL | Status: AC
Start: 2017-05-13 — End: 2017-05-13
  Administered 2017-05-13: 30 mL via ORAL
  Filled 2017-05-13: qty 30

## 2017-05-13 MED ORDER — ACETAMINOPHEN 500 MG PO TABS
1000.0000 mg | ORAL_TABLET | Freq: Once | ORAL | Status: AC
Start: 2017-05-13 — End: 2017-05-13
  Administered 2017-05-13: 1000 mg via ORAL
  Filled 2017-05-13: qty 2

## 2017-05-13 MED ORDER — LISINOPRIL 10 MG PO TABS
10.0000 mg | ORAL_TABLET | Freq: Once | ORAL | Status: AC
Start: 2017-05-13 — End: 2017-05-13
  Administered 2017-05-13: 10 mg via ORAL
  Filled 2017-05-13: qty 1

## 2017-05-13 MED ORDER — CHLORPHENIRAMINE-DM 4-30 MG PO TABS
1.0000 | ORAL_TABLET | Freq: Four times a day (QID) | ORAL | 0 refills | Status: AC | PRN
Start: 2017-05-13 — End: 2017-05-23

## 2017-05-13 MED ORDER — LIDOCAINE VISCOUS 2 % MT SOLN
10.0000 mL | Freq: Once | OROMUCOSAL | Status: AC
Start: 2017-05-13 — End: 2017-05-13
  Administered 2017-05-13: 10 mL via OROMUCOSAL
  Filled 2017-05-13: qty 15

## 2017-05-13 MED ORDER — GUAIFENESIN 100 MG/5ML PO SOLN
200.0000 mg | Freq: Once | ORAL | Status: AC
Start: 2017-05-13 — End: 2017-05-13
  Administered 2017-05-13: 200 mg via ORAL
  Filled 2017-05-13: qty 10

## 2017-05-13 MED ORDER — ASPIRIN 325 MG PO TABS
325.0000 mg | ORAL_TABLET | Freq: Once | ORAL | Status: AC
Start: 2017-05-13 — End: 2017-05-13
  Administered 2017-05-13: 325 mg via ORAL
  Filled 2017-05-13: qty 1

## 2017-05-13 MED ORDER — ONDANSETRON 4 MG PO TBDP
4.0000 mg | ORAL_TABLET | Freq: Once | ORAL | Status: AC
Start: 2017-05-13 — End: 2017-05-13
  Administered 2017-05-13: 4 mg via ORAL
  Filled 2017-05-13: qty 1

## 2017-05-13 MED ORDER — ONDANSETRON HCL 4 MG/2ML IJ SOLN
4.0000 mg | Freq: Once | INTRAMUSCULAR | Status: AC
Start: 2017-05-13 — End: 2017-05-13
  Administered 2017-05-13: 4 mg via INTRAVENOUS
  Filled 2017-05-13: qty 2

## 2017-05-13 MED ORDER — FAMOTIDINE 20 MG PO TABS
20.0000 mg | ORAL_TABLET | Freq: Two times a day (BID) | ORAL | 0 refills | Status: AC
Start: 2017-05-13 — End: 2017-05-18

## 2017-05-13 MED ORDER — AMLODIPINE BESYLATE 5 MG PO TABS
10.0000 mg | ORAL_TABLET | Freq: Once | ORAL | Status: DC
Start: 2017-05-13 — End: 2017-05-13

## 2017-05-13 NOTE — ED Notes (Signed)
Spoke to pt's wife with pt's status, plan of care with f/u with Specialist  As given rec. Verbal understand

## 2017-05-13 NOTE — ED Provider Notes (Signed)
[facility]&P      Visit date: 05/13/2017      CLINICAL SUMMARY          Diagnosis:    .     Final diagnoses:   Chest pain, unspecified type         MDM Notes:    46 yo male pmh htn, osa, asthma, gerd presents with acute onset chest pain, heart score low risk, asa here, trop x2. Check labs for lyte abnl, anemia, infc. cxr to eval cardiomegaly or pna. Reassess. Consider gi cocktail for potential gerd. Follow up cards. Wells score low critieria= Perc neg.    Heart Score for Chest Pain Patients Score   History Highly Suspicious 2 points 1    Moderately Suspicious 1 point     Slightly or Non-Suspicious 0 point    ECG Significant ST Depression 2 points 1    Nonspecific Repolarization 1 point     Normal 0 point    Age Greater than or equal to 65 years 2 points 0    Greater than 45 and less than 65 years 1 point     Less than or equal to 45 years 0 point    Risk Factors Greater than 3 risk factors or History of CAD 2 points 1    1 or 2 Risk Factors 1 point     No Risk Factors 0 point    Troponin Greater than or equal to 3 times normal limit 2 points 0    Greater than 1 and less than 3 times normal limit 1 point     Less than or equal to normal limit 0 point    Risk Factors: DM, current or recent (less than one month) smoker, HTN, HIP, family history of CAD, and obesity   TOTAL SCORE 3                Disposition:         Discharge         Discharge Prescriptions     Medication Sig Dispense Auth. Provider    Chlorpheniramine-DM (CORICIDIN COUGH/COLD) 4-30 MG Tab Take 1 tablet by mouth every 6 (six) hours as needed (congestion).for up to 10 days 20 each Estrella Myrtle, MD    famotidine (PEPCID) 20 MG tablet Take 1 tablet (20 mg total) by mouth 2 (two) times daily.for 5 days 10 tablet Estrella Myrtle, MD                      CLINICAL INFORMATION        HPI:      Chief Complaint: Chest Pain  .    Ricky Huffman is a 46 y.o. male pmh htn, osa, asthma, gerd, anxiety who  presents with acute onset of L sided cp starting 30 min prior to arrival, assoc with diaphoresis and nausea x 15 min. Began while driving.  Patient recently had pna and is on his last day of abx.     History obtained from: Patient          ROS:      Review of Systems   Constitutional: Positive for diaphoresis. Negative for fatigue.   HENT: Negative for rhinorrhea.    Eyes: Negative for visual disturbance.   Respiratory: Negative for cough, shortness of breath and wheezing.    Cardiovascular: Positive for chest pain. Negative for leg swelling.   Gastrointestinal: Positive for nausea. Negative for abdominal pain, constipation, diarrhea  and vomiting.   Genitourinary: Negative for dysuria.   Musculoskeletal: Negative for back pain and myalgias.   Skin: Negative for rash.   Neurological: Negative for headaches.           Physical Exam:      Pulse 63  BP (!) 175/110  Resp 16  SpO2 97 %  Temp 98.1 F (36.7 C)    Physical Exam   Constitutional: He is oriented to person, place, and time. He appears well-developed and well-nourished. No distress.   HENT:   Head: Normocephalic and atraumatic.   Eyes: Conjunctivae are normal. Pupils are equal, round, and reactive to light.   Neck: Neck supple. No tracheal deviation present.   Cardiovascular: Normal rate, normal heart sounds and intact distal pulses.    No murmur heard.  L lateral lower chest wall ttp   Pulmonary/Chest: Effort normal and breath sounds normal. No respiratory distress. He has no wheezes.   Abdominal: Soft. Bowel sounds are normal. He exhibits no distension and no mass. There is no tenderness.   Musculoskeletal: He exhibits no edema or tenderness.   Neurological: He is alert and oriented to person, place, and time.   Skin: Skin is warm and dry. Capillary refill takes less than 2 seconds. He is not diaphoretic.   Psychiatric: He has a normal mood and affect.   Nursing note and vitals reviewed.              PAST HISTORY        Primary Care Provider: Kenyon Ana, MD        PMH/PSH:    .     Past Medical History:   Diagnosis Date   . Anxiety     due to sedation   . Asthma without status asthmaticus     rare use of rescue inhaler will bring   . Claustrophobia    . Difficulty in walking(719.7)    . Ear, nose and throat disorder     seasonal allergies   . Heartburn     prilosec   . Pneumonia    . Sleep apnea     has o2 by bedside        He has a past surgical history that includes Rotator cuff repair; Hernia repair; ARTHROSCOPY, ANKLE (03/30/2014); STABILIZATION, ANKLE, BROSTROM PROCEDURE (03/30/2014); and ARTHROPLASTY, ANKLE (03/30/2014).      Social/Family History:      He reports that he has never smoked. He has never used smokeless tobacco. He reports that he drinks alcohol. He reports that he does not use drugs.    No family history on file.      Listed Medications on Arrival:    .     Home Medications             ALBUTEROL IN     Inhale 1 application into the lungs as needed.     amLODIPine (NORVASC) 10 MG tablet     Take 10 mg by mouth daily.     amLODIPine (NORVASC) 10 MG tablet     Take by mouth.     benzonatate (TESSALON) 100 MG capsule          docusate sodium (COLACE) 100 MG capsule     Take 1 capsule (100 mg total) by mouth 2 (two) times daily for constipation.     enoxaparin (LOVENOX) 40 MG/0.4ML Solution     Inject 0.4 mLs (40 mg total) into the skin once daily.( start 12 to  24 hours after surgery)     moxifloxacin (AVELOX) 400 MG tablet          OxyCODONE HCl ER (OXYCONTIN) 30 MG Tablet Extended Release 12 hour Abuse-Deterrent     Take 1 tablet (30 mg total) by mouth every 12 (twelve) hours as needed for pain     predniSONE (DELTASONE) 20 MG tablet                                                                                                                                      Allergies: He is allergic to midazolam; nsaids; and toradol [ketorolac tromethamine].            VISIT INFORMATION        Clinical Course in the ED:          ED Course as of  May 13 948   Mon May 13, 2017   0759 Patient has not taken his bp meds- norvasc- has them at home. He has also been off his gerd meds for months.   [JB]   0831 Will give dose of lisinopril here and hold norvasc  [JB]   0917 Cxr my read: NAF  [JB]   0936 Pain free  [JB]   0948 Trop neg x2  [JB]      ED Course User Index  [JB] Estrella Myrtle, MD         Medications Given in the ED:    .     ED Medication Orders     Start Ordered     Status Ordering Provider    05/13/17 0901 05/13/17 0900  acetaminophen (TYLENOL) tablet 1,000 mg  Once     Route: Oral  Ordered Dose: 1,000 mg     Last MAR action:  Given Ivorie Uplinger L    05/13/17 0901 05/13/17 0900  guaiFENesin (ROBITUSSIN) oral solution 200 mg  Once     Route: Oral  Ordered Dose: 200 mg     Last MAR action:  Given Saloma Cadena L    05/13/17 0824 05/13/17 0823  lisinopril (PRINIVIL,ZESTRIL) tablet 10 mg  Once     Route: Oral  Ordered Dose: 10 mg     Last MAR action:  Given Permelia Bamba L    05/13/17 0730 05/13/17 0729    Once in ED     Route: Oral  Ordered Dose: 10 mg     Discontinued Jleigh Striplin L    05/13/17 0727 05/13/17 0726  lidocaine viscous (XYLOCAINE) 2 % solution 10 mL  Once     Route: Mouth/Throat  Ordered Dose: 10 mL     Last MAR action:  Given Bexley Mclester L    05/13/17 0727 05/13/17 0726  alum & mag hydroxide-simethicone (MAALOX PLUS) 200-200-20 mg/5 mL suspension 30 mL  Once     Route: Oral  Ordered Dose: 30 mL     Last MAR  action:  Given Estrella Myrtle    05/13/17 0720 05/13/17 0719  aspirin tablet 325 mg  Once     Route: Oral  Ordered Dose: 325 mg     Last MAR action:  Given Jamale Spangler L    05/13/17 0720 05/13/17 0719  ondansetron (ZOFRAN) injection 4 mg  Once     Route: Intravenous  Ordered Dose: 4 mg     Last MAR action:  Given Ples Trudel L            Procedures:      Procedures      Interpretations:      O2 sat-           saturation: 97 %; Oxygen use: room air; Interpretation: Normal    EKG -             interpreted by me:      NSR, no stemi, isolated TWI III  Rate: 63  PR 158  QRS 76  QTC 405         Wells Score      Value   Previous DVT or PE  0   HR greater than 100  0   Surgery within 4 weeks, or Immobilization in last 3 days  0   Clinical Signs/Symptoms of DVT  0   Alternative diagnosis less likely than PE  0   Hemoptysis  0   Malignancy with treatment within 6 months or palliative care  0   Wells Score for PE  0      PERC Score      Value   Age (read only)  less than or equal to 50 years   HR >= 100  0   O2 Sat on Room Air < 95%  0   Prior History of Venous Thromboembolism  0   Trauma or Surgery within 4 weeks  0   Hemoptysis  0   Exogenous Estrogen  0   Unilateral Leg Swelling  0   PERC Rule Score (Calculated)  0                RESULTS        Recent Lab Results:      Results     Procedure Component Value Units Date/Time    i-Stat Troponin [[id number]] Collected:  05/13/17 0937     Updated:  05/13/17 0947     i-STAT Troponin 0.00 ng/mL     Narrative:       pld draw at 0930    CBC with differential [[id number]] Collected:  05/13/17 0724    Specimen:  Blood from Blood Updated:  05/13/17 0755     WBC 9.72 x10 3/uL      Hgb 15.7 g/dL      Hematocrit 84.6 %      Platelets 211 x10 3/uL      RBC 5.46 x10 6/uL      MCV 86.3 fL      MCH 28.8 pg      MCHC 33.3 g/dL      RDW 14 %      MPV 9.7 fL      Neutrophils 53.0 %      Lymphocytes Automated 30.0 %      Monocytes 8.0 %      Eosinophils Automated 4.0 %      Basophils Automated 0.0 %      Nucleated RBC Unmeasured /100 WBC      Absolute NRBC  Unmeasured x10 3/uL     Magnesium [[id number]] Collected:  05/13/17 0724     Updated:  05/13/17 0752     Magnesium 2.2 mg/dL     Comprehensive metabolic panel [[id number]]  (Abnormal) Collected:  05/13/17 0724    Specimen:  Blood Updated:  05/13/17 0752     Glucose 104 (H) mg/dL      BUN 51.8 mg/dL      Creatinine 1.4 (H) mg/dL      Sodium 841 mEq/L      Potassium 4.4 mEq/L      Chloride 102 mEq/L      CO2 25 mEq/L      Calcium 9.0 mg/dL      Protein,  Total 6.8 g/dL      Albumin 3.8 g/dL      AST (SGOT) 21 U/L      ALT 30 U/L      Alkaline Phosphatase 70 U/L      Bilirubin, Total 0.6 mg/dL      Globulin 3.0 g/dL      Albumin/Globulin Ratio 1.3     Anion Gap 11.0    Lipase [[id number]] Collected:  05/13/17 0724    Specimen:  Blood Updated:  05/13/17 0752     Lipase 60 U/L     GFR [[id number]] Collected:  05/13/17 0724     Updated:  05/13/17 0752     EGFR >60.0    i-Stat Troponin [[id number]] Collected:  05/13/17 0724     Updated:  05/13/17 0737     i-STAT Troponin 0.02 ng/mL               Radiology Results:      XR Chest  AP Portable   Final Result    No acute findings.      Bea Laura, MD    05/13/2017 8:31 AM                  Scribe Attestation:      No scribe involved in the care of this patient                            Estrella Myrtle, MD  05/13/17 [phone]

## 2017-05-13 NOTE — ED Student (Signed)
Date: 05/13/17  Patient Name: Ricky Huffman  Student name: Doristine Counter    History of Presenting Illness:    Ricky Huffman, 46 y.o M who presents with complaints of CP that began 30 minutes prior to arrival to ED. He states he was driving to work from [place], his normal route to work, and developed L sided CP, non radiating, with nausea and a headache. Pt denies any emesis. States he became diaphoretic with onset of pain. States he has not taken his hypertensive medications today. He states he was diagnosed with L lung pneumonia last week and placed on ABX and prednisone therapy which he finished today. States productive cough of clear sputum. Denies any fevers. Denies any leg swelling.     Chief Complaint:   Chest Pain          History obtained from: Patient     Interpreter used: none     PMD:     Past Medical History:     Past Medical History:   Diagnosis Date   . Anxiety     due to sedation   . Asthma without status asthmaticus     rare use of rescue inhaler will bring   . Claustrophobia    . Difficulty in walking(719.7)    . Ear, nose and throat disorder     seasonal allergies   . Heartburn     prilosec   . Pneumonia    . Sleep apnea     has o2 by bedside          Past Surgical History:     Past Surgical History:   Procedure Laterality Date   . ARTHROPLASTY, ANKLE  03/30/2014    Procedure: ARTHROPLASTY, ANKLE;  Surgeon: Grace Bushy, DO;  Location: [facility];  Service: Orthopedics;  Laterality: Left;  Allograft ([place])   . ARTHROSCOPY, ANKLE  03/30/2014    Procedure: ARTHROSCOPY, ANKLE;  Surgeon: Grace Bushy, DO;  Location: [facility];  Service: Orthopedics;  Laterality: Left;  LEFT ANKLE SCOPE WITH DEBRIDEMENT, BROSTROM REPAIR AND ARTHROPLASTY     . HERNIA REPAIR      inguinal   . ROTATOR CUFF REPAIR     . STABILIZATION, ANKLE, BROSTROM PROCEDURE  03/30/2014    Procedure: STABILIZATION, ANKLE, BROSTROM PROCEDURE;  Surgeon: Grace Bushy, DO;  Location:  MAIN OR;   Service: Orthopedics;  Laterality: Left;         Family History:   The patient has a family history of        Social History:     Social History     Social History   . Marital status: Married     Spouse name: N/A   . Number of children: N/A   . Years of education: N/A     Occupational History   . Not on file.     Social History Main Topics   . Smoking status: Never Smoker   . Smokeless tobacco: Never Used   . Alcohol use Yes      Comment: moderate   . Drug use: No   . Sexual activity: Not on file     Other Topics Concern   . Not on file     Social History Narrative   . No narrative on file         Allergies:     Allergies   Allergen Reactions   . Midazolam Anxiety   . Nsaids Hives and Swelling   .  Toradol [Ketorolac Tromethamine] Hives and Swelling       Medications:       Current Facility-Administered Medications:   .  alum & mag hydroxide-simethicone (MAALOX PLUS) 200-200-20 mg/5 mL suspension 30 mL, 30 mL, Oral, Once, Sherron Flemings, Jamiee L, MD  .  amLODIPine (NORVASC) tablet 10 mg, 10 mg, Oral, Once in ED, Dianne Dun L, MD  .  aspirin tablet 325 mg, 325 mg, Oral, Once, Dianne Dun L, MD  .  lidocaine viscous (XYLOCAINE) 2 % solution 10 mL, 10 mL, Mouth/Throat, Once, Belsky, Jamiee L, MD  .  ondansetron (ZOFRAN) injection 4 mg, 4 mg, Intravenous, Once, Estrella Myrtle, MD    Current Outpatient Prescriptions:   .  amLODIPine (NORVASC) 10 MG tablet, Take by mouth., Disp: , Rfl:   .  ALBUTEROL IN, Inhale 1 application into the lungs as needed., Disp: , Rfl:   .  amLODIPine (NORVASC) 10 MG tablet, Take 10 mg by mouth daily., Disp: , Rfl:   .  benzonatate (TESSALON) 100 MG capsule, , Disp: , Rfl:   .  docusate sodium (COLACE) 100 MG capsule, Take 1 capsule (100 mg total) by mouth 2 (two) times daily for constipation., Disp: 30 capsule, Rfl: 0  .  enoxaparin (LOVENOX) 40 MG/0.4ML Solution, Inject 0.4 mLs (40 mg total) into the skin once daily.( start 12 to 24 hours after surgery), Disp: 18 mL, Rfl: 0  .  moxifloxacin  (AVELOX) 400 MG tablet, , Disp: , Rfl:   .  OxyCODONE HCl ER (OXYCONTIN) 30 MG Tablet Extended Release 12 hour Abuse-Deterrent, Take 1 tablet (30 mg total) by mouth every 12 (twelve) hours as needed for pain, Disp: 14 tablet, Rfl: 0  .  predniSONE (DELTASONE) 20 MG tablet, , Disp: , Rfl:     Review of Systems:       Review of Systems   Constitutional: Positive for diaphoresis and fever. Negative for chills.   HENT: Negative.  Negative for congestion and sore throat.    Respiratory: Positive for cough. Negative for shortness of breath.         Clear sputum with productive cough    Cardiovascular: Positive for chest pain. Negative for leg swelling.        CP that began 30 minutes PTA   Gastrointestinal: Positive for nausea. Negative for abdominal pain and vomiting.   Genitourinary: Negative.    Neurological: Positive for headaches.         Physical Exam:     VS: BP (!) 175/110   Pulse 63   Temp 98.1 F (36.7 C) (Tympanic)   Resp 16   Ht 5\' 8"  (1.727 m)   Wt 88.5 kg   SpO2 97%   BMI 29.65 kg/m    Physical Exam   Constitutional: He is oriented to person, place, and time and well-developed, well-nourished, and in no distress.   HENT:   Head: Normocephalic and atraumatic.   Neck: Normal range of motion. Neck supple.   Cardiovascular: Normal rate, regular rhythm, S1 normal, S2 normal and normal heart sounds.  Exam reveals no gallop and no friction rub.    No murmur heard.  Pulmonary/Chest: Effort normal and breath sounds normal. He exhibits tenderness.       Abdominal: Soft. Normal appearance and bowel sounds are normal. There is no tenderness. There is no guarding.   Musculoskeletal: Normal range of motion.   Neurological: He is alert and oriented to person, place, and time. GCS score is 15.  Skin: Skin is warm and dry.   Psychiatric: Mood, memory, affect and judgment normal.       Assessment/Plan:       Differential Diagnosis:  AMI/ACS  Costochondritis  Pneumonia     MDM:   Ricky Huffman is a well appearing 46 yo  Male who presents with complaints of chest pain. Pt has recently stopped taking GERD medications because he thought that the previous course of treatment had cured it. Pt has been having increased nausea with taking his meds in the morning. States recent hx of PNA with prednisone and ABX therapy, no decongestant.   Plan for CV workup to r/o ACS/AMI. Will do 2 troponin's due to recent onset of CP about 30 minutes prior to arrival to ED.   Given ASA, maaolox, vicious lidocaine and Zofran for acute management of CP symptoms.   1st troponin is 0.02 at 0737. CBC is grossly normal. Awaiting CMP, Lipase and Mag results.     Labs:     Results     Procedure Component Value Units Date/Time    i-Stat Troponin [[id number]] Collected:  05/13/17 0724     Updated:  05/13/17 0737     i-STAT Troponin 0.02 ng/mL     CBC with differential [[id number]] Collected:  05/13/17 0724    Specimen:  Blood from Blood Updated:  05/13/17 0735     WBC 9.72 x10 3/uL      Hgb 15.7 g/dL      Hematocrit 78.2 %      Platelets 211 x10 3/uL      RBC 5.46 x10 6/uL      MCV 86.3 fL      MCH 28.8 pg      MCHC 33.3 g/dL      RDW 14 %      MPV 9.7 fL      Nucleated RBC Unmeasured /100 WBC      Absolute NRBC Unmeasured x10 3/uL     Comprehensive metabolic panel [[id number]] Collected:  05/13/17 0724    Specimen:  Blood Updated:  05/13/17 0735    Lipase [[id number]] Collected:  05/13/17 0724    Specimen:  Blood Updated:  05/13/17 0735          Rads:     Radiology Results (24 Hour)     ** No results found for the last 24 hours. **            Clinical Impression:     Final Diagnosis:    ED disposition:     Follow up plan (if discharged):    New Prescriptions:

## 2017-05-13 NOTE — ED Triage Notes (Signed)
Onset half hour ago developed left chest pain, non radiating, headache, nausea, diaphoresis

## 2017-05-13 NOTE — Discharge Instructions (Signed)
Dear Ricky Huffman:    Thank you for choosing one of [facility] emergency departments.  I hope your visit today was EXCELLENT.    Specific instructions for your visit today:      Chest Pain of Unclear Etiology    You have been seen for chest pain. The cause of your pain is not yet known.    Your doctor has learned about your medical history, examined you, and checked any tests that were done. Still, it is unclear why you are having pain. The doctor thinks there is only a very small chance that your pain is caused by a life-threatening condition. Later, your primary care doctor might do more tests or check you again.    Sometimes chest pain is caused by a dangerous condition, like a heart attack, aorta injury, blood clot in the lung, or collapsed lung. It is unlikely that your pain is caused by a life-threatening condition if: Your chest pain lasts only a few seconds at a time; you are not short of breath, nauseated (sick to your stomach), sweaty, or lightheaded; your pain gets worse when you twist or bend; your pain improves with exercise or hard work.    Chest pain is serious. It is VERY IMPORTANT that you follow up with your regular doctor and seek medical attention immediately here or at the nearest Emergency Department if your symptoms become worse or they change.    YOU SHOULD SEEK MEDICAL ATTENTION IMMEDIATELY, EITHER HERE OR AT THE NEAREST EMERGENCY DEPARTMENT, IF ANY OF THE FOLLOWING OCCURS:   Your pain gets worse.   Your pain makes you short of breath, nauseated, or sweaty.   Your pain gets worse when you walk, go up stairs, or exert yourself.   You feel weak, lightheaded, or faint.   It hurts to breathe.   Your leg swells.   Your symptoms get worse or you have new symptoms or concerns.                 If you do not continue to improve or your condition worsens, please contact your doctor or return immediately to the Emergency Department.    Sincerely,  Estrella Myrtle,  MD  Attending Emergency Physician  [facility] Emergency Department    OBTAINING A PRIMARY CARE APPOINTMENT    Primary care physicians (PCPs, also known as primary care doctors) are either internists or family medicine doctors. Both types of PCPs focus on health promotion, disease prevention, patient education and counseling, and treatment of acute and chronic medical conditions.    Call for an appointment with a primary care doctor.  Ask to see who is taking new patients.     [facility]  telephone:  [phone]  [email or web address]    For a pediatrician, call the [facility] referral line below.  You can also call to make an appointment at [facility] (except [employer] and [place] [facility]):    [address]  [place], [place] [address]  [phone]    Valentina Lucks  Call [phone] (available 24 hours a day, 7 days a week) if you need any further referrals and we can help you find a primary care doctor or specialist.  Also, available online at:  [email or web address]    For more information regarding our services at [facility], please call the number above or visit the website [email or web address]    YOUR CONTACT INFORMATION  Before leaving please check with  registration to make sure we have an up-to-date contact number.  You can call registration at [phone], Option 7 [facility] location) or [phone], Option 1 [facility] location) to update your information.  For questions about your hospital bill, please call [phone].  For questions about your Emergency Dept Physician bill please call [phone].      FREE HEALTH SERVICES  If you need help with health or social services, please call 2-1-1 for a free referral to resources in your area.  2-1-1 is a free service connecting people with information on health insurance, free clinics, pregnancy, mental health, dental care, food assistance, housing, and substance  abuse counseling.  Also, available online at:  http://www.211[place].org    MEDICAL RECORDS AND TESTS  Certain laboratory test results do not come back the same day, for example urine cultures.   We will contact you if other important findings are noted.  Radiology films are often reviewed again to ensure accuracy.  If there is any discrepancy, we will notify you.      Please call [phone] [facility] location) or [phone] ([place] location) to pick up a complimentary CD of any radiology studies performed.  If you or your doctor would like to request a copy of your medical records, please call [phone].      ORTHOPEDIC INJURY   Please know that significant injuries can exist even when an initial x-ray is read as normal or negative.  This can occur because some fractures (broken bones) are not initially visible on x-rays.  For this reason, close outpatient follow-up with your primary care doctor or bone specialist (orthopedist) is required.    MEDICATIONS AND FOLLOWUP  Please be aware that some prescription medications can cause drowsiness.  Use caution when driving or operating machinery.    The examination and treatment you have received in our Emergency Department is provided on an emergency basis, and is not intended to be a substitute for your primary care physician.  It is important that your doctor checks you again and that you report any new or remaining problems at that time.      24 HOUR PHARMACIES  Two nearby 24 hour pharmacies are:    [facility]  [address]  [place], [place] [address]  [phone]    [facility]  [address]  [place], [place] [address]  [phone]      ASSISTANCE WITH INSURANCE    Affordable Care Act  [facility])  Call to start or finish an application, compare plans, enroll or ask a question.  [phone]  TTY: [phone]  Web:  Healthcare.gov    Help Enrolling in [facility]  [employer] [place]  ([phone] 078-1628 (TOLL-FREE)  [phone] (TTY)  Web:   Http://www.coverva.org    Local Help Enrolling in the [facility]  [place] [facility]  [phone] (MAIN)  Email:  health-help@nvfs .org  Web:  BlackjackMyths.is  Address:  [address] [place], [place] [address]    SEDATING MEDICATIONS  Sedating medications include strong pain medications (e.g. narcotics), muscle relaxers, benzodiazepines (used for anxiety and as muscle relaxers), Benadryl/diphenhydramine and other antihistamines for allergic reactions/itching, and other medications.  If you are unsure if you have received a sedating medication, please ask your physician or nurse.  If you received a sedating medication: DO NOT drive a car. DO NOT operate machinery. DO NOT perform jobs where you need to be alert.  DO NOT drink alcoholic beverages while taking this medicine.     If  you get dizzy, sit or lie down at the first signs. Be careful going up and down stairs.  Be extra careful to prevent falls.     Never give this medicine to others.     Keep this medicine out of reach of children.     Do not take or save old medicines. Throw them away when outdated.     Keep all medicines in a cool, dry place. DO NOT keep them in your bathroom medicine cabinet or in a cabinet above the stove.    MEDICATION REFILLS  Please be aware that we cannot refill any prescriptions through the ER. If you need further treatment from what is provided at your ER visit, please follow up with your primary care doctor or your pain management specialist.

## 2017-06-02 ENCOUNTER — Emergency Department
Admission: EM | Admit: 2017-06-02 | Discharge: 2017-06-02 | Disposition: A | Discharge status: Home / Self Care | Payer: Commercial Managed Care - PPO | Attending: Emergency Medicine | Admitting: Emergency Medicine

## 2017-06-02 DIAGNOSIS — R110 Nausea: Secondary | ICD-10-CM | POA: Insufficient documentation

## 2017-06-02 DIAGNOSIS — T39395A Adverse effect of other nonsteroidal anti-inflammatory drugs [NSAID], initial encounter: Secondary | ICD-10-CM | POA: Insufficient documentation

## 2017-06-02 DIAGNOSIS — T7840XA Allergy, unspecified, initial encounter: Secondary | ICD-10-CM | POA: Insufficient documentation

## 2017-06-02 DIAGNOSIS — Z79899 Other long term (current) drug therapy: Secondary | ICD-10-CM | POA: Insufficient documentation

## 2017-06-02 DIAGNOSIS — R079 Chest pain, unspecified: Secondary | ICD-10-CM | POA: Insufficient documentation

## 2017-06-02 DIAGNOSIS — R0602 Shortness of breath: Secondary | ICD-10-CM | POA: Insufficient documentation

## 2017-06-02 DIAGNOSIS — Z886 Allergy status to analgesic agent status: Secondary | ICD-10-CM | POA: Insufficient documentation

## 2017-06-02 DIAGNOSIS — G4730 Sleep apnea, unspecified: Secondary | ICD-10-CM | POA: Insufficient documentation

## 2017-06-02 DIAGNOSIS — J45909 Unspecified asthma, uncomplicated: Secondary | ICD-10-CM | POA: Insufficient documentation

## 2017-06-02 DIAGNOSIS — J029 Acute pharyngitis, unspecified: Secondary | ICD-10-CM | POA: Insufficient documentation

## 2017-06-02 DIAGNOSIS — X58XXXA Exposure to other specified factors, initial encounter: Secondary | ICD-10-CM | POA: Insufficient documentation

## 2017-06-02 LAB — GFR: EGFR: >60

## 2017-06-02 LAB — CBC AND DIFFERENTIAL
Absolute NRBC: 0 10*3/uL
Basophils Absolute Automated: 0.04 10*3/uL (ref 0.00–0.20)
Basophils Automated: 0.4 %
Eosinophils Absolute Automated: 0.41 10*3/uL (ref 0.00–0.70)
Eosinophils Automated: 4.2 %
Hematocrit: 43.4 % (ref 42.0–52.0)
Hgb: 14.4 g/dL (ref 13.0–17.0)
Immature Granulocytes Absolute: 0.03 10*3/uL
Immature Granulocytes: 0.3 %
Lymphocytes Absolute Automated: 2.15 10*3/uL (ref 0.50–4.40)
Lymphocytes Automated: 22.1 %
MCH: 28.3 pg (ref 28.0–32.0)
MCHC: 33.2 g/dL (ref 32.0–36.0)
MCV: 85.3 fL (ref 80.0–100.0)
MPV: 9.4 fL (ref 9.4–12.3)
Monocytes Absolute Automated: 0.99 10*3/uL (ref 0.00–1.20)
Monocytes: 10.2 %
Neutrophils Absolute: 6.13 10*3/uL (ref 1.80–8.10)
Neutrophils: 62.8 %
Nucleated RBC: 0 /100 WBC (ref 0.0–1.0)
Platelets: 191 10*3/uL (ref 140–400)
RBC: 5.09 10*6/uL (ref 4.70–6.00)
RDW: 13 % (ref 12–15)
WBC: 9.75 10*3/uL (ref 3.50–10.80)

## 2017-06-02 LAB — BASIC METABOLIC PANEL
Anion Gap: 10 (ref 5.0–15.0)
BUN: 19 mg/dL (ref 9–28)
CO2: 21 mEq/L — ABNORMAL LOW (ref 22–29)
Calcium: 9.3 mg/dL (ref 8.5–10.5)
Chloride: 108 mEq/L (ref 100–111)
Creatinine: 1.2 mg/dL (ref 0.7–1.3)
Glucose: 85 mg/dL (ref 70–100)
Potassium: 4 mEq/L (ref 3.5–5.1)
Sodium: 139 mEq/L (ref 136–145)

## 2017-06-02 LAB — GROUP A STREP, RAPID ANTIGEN: Group A Strep, Rapid Antigen: NEGATIVE

## 2017-06-02 MED ORDER — GUAIFENESIN 100 MG/5ML PO SOLN
ORAL | Status: DC
Start: 2017-06-02 — End: 2017-06-02
  Filled 2017-06-02: qty 25

## 2017-06-02 MED ORDER — DEXAMETHASONE SODIUM PHOSPHATE 10 MG/ML IJ SOLN
10.0000 mg | Freq: Once | INTRAMUSCULAR | Status: AC
Start: 2017-06-02 — End: 2017-06-02
  Administered 2017-06-02: 14:00:00 10 mg via INTRAVENOUS
  Filled 2017-06-02: qty 1

## 2017-06-02 MED ORDER — GUAIFENESIN ER 600 MG PO TB12
600.0000 mg | ORAL_TABLET | Freq: Two times a day (BID) | ORAL | 0 refills | Status: AC
Start: 2017-06-02 — End: ?

## 2017-06-02 MED ORDER — DIPHENHYDRAMINE HCL 50 MG/ML IJ SOLN
25.0000 mg | Freq: Once | INTRAMUSCULAR | Status: DC
Start: 2017-06-02 — End: 2017-06-02
  Filled 2017-06-02: qty 1

## 2017-06-02 MED ORDER — SODIUM CHLORIDE 0.9 % IV BOLUS
1000.0000 mL | Freq: Once | INTRAVENOUS | Status: AC
Start: 2017-06-02 — End: 2017-06-02
  Administered 2017-06-02: 14:00:00 1000 mL via INTRAVENOUS

## 2017-06-02 MED ORDER — SODIUM CHLORIDE 0.9 % IN NEBU
3.0000 mL | INHALATION_SOLUTION | Freq: Once | RESPIRATORY_TRACT | Status: AC
Start: 2017-06-02 — End: 2017-06-02
  Administered 2017-06-02: 14:00:00 3 mL via RESPIRATORY_TRACT
  Filled 2017-06-02: qty 3

## 2017-06-02 MED ORDER — GUAIFENESIN ER 600 MG PO TB12
600.0000 mg | ORAL_TABLET | Freq: Once | ORAL | Status: DC
Start: 2017-06-02 — End: 2017-06-02

## 2017-06-02 MED ORDER — CETIRIZINE HCL 10 MG PO TABS
10.0000 mg | ORAL_TABLET | Freq: Once | ORAL | Status: AC
Start: 2017-06-02 — End: 2017-06-02
  Administered 2017-06-02: 14:00:00 10 mg via ORAL
  Filled 2017-06-02: qty 1

## 2017-06-02 MED ORDER — ONDANSETRON 4 MG PO TBDP
4.0000 mg | ORAL_TABLET | Freq: Four times a day (QID) | ORAL | 0 refills | Status: AC | PRN
Start: 2017-06-02 — End: ?

## 2017-06-02 MED ORDER — GUAIFENESIN 100 MG/5ML PO SOLN
200.0000 mg | Freq: Once | ORAL | Status: AC
Start: 2017-06-02 — End: 2017-06-02
  Administered 2017-06-02: 200 mg via ORAL

## 2017-06-02 MED ORDER — IBUPROFEN 400 MG PO TABS
800.0000 mg | ORAL_TABLET | Freq: Once | ORAL | Status: AC
Start: 2017-06-02 — End: 2017-06-02
  Administered 2017-06-02: 12:00:00 800 mg via ORAL
  Filled 2017-06-02: qty 2

## 2017-06-02 MED ORDER — RACEPINEPHRINE HCL 2.25 % IN NEBU
0.5000 mL | INHALATION_SOLUTION | Freq: Once | RESPIRATORY_TRACT | Status: AC
Start: 2017-06-02 — End: 2017-06-02
  Administered 2017-06-02: 14:00:00 0.5 mL via RESPIRATORY_TRACT
  Filled 2017-06-02: qty 0.5

## 2017-06-02 MED ORDER — MAGIC MOUTHWASH ORAL SUSPENSION
15.0000 mL | Freq: Four times a day (QID) | ORAL | 0 refills | Status: AC
Start: 2017-06-02 — End: ?

## 2017-06-02 MED ORDER — ACETAMINOPHEN 500 MG PO TABS
1000.0000 mg | ORAL_TABLET | Freq: Once | ORAL | Status: AC
Start: 2017-06-02 — End: 2017-06-02
  Administered 2017-06-02: 1000 mg via ORAL
  Filled 2017-06-02: qty 2

## 2017-06-02 MED ORDER — LIDOCAINE VISCOUS 2 % MT SOLN
5.0000 mL | Freq: Once | OROMUCOSAL | Status: AC
Start: 2017-06-02 — End: 2017-06-02
  Administered 2017-06-02: 12:00:00 5 mL via OROMUCOSAL
  Filled 2017-06-02: qty 15

## 2017-06-02 MED ORDER — DIPHENHYDRAMINE HCL 25 MG PO TABS
25.0000 mg | ORAL_TABLET | Freq: Four times a day (QID) | ORAL | 0 refills | Status: AC | PRN
Start: 2017-06-02 — End: ?

## 2017-06-02 NOTE — ED Provider Notes (Signed)
Physician/Midlevel provider first contact with patient: 06/02/17 1342         History     Chief Complaint   Patient presents with   . Chest Pain   . Allergic Reaction     Historian: Patient     46 y.o. male with h/o asthma, heartburn, and anxiety p/w sudden onset of persistent chest pain ~30 minute PTA while traveling home. Associated with SOB and nausea. Patient states he believes his sxs are due to an allergic reaction to NSAIDS he was given for a sore throat.     PMD: Pcp, [place], MD       The history is provided by the patient.            Past Medical History:   Diagnosis Date   . Anxiety     due to sedation   . Asthma without status asthmaticus     rare use of rescue inhaler will bring   . Ear, nose and throat disorder     seasonal allergies   . Heartburn     prilosec   . Sleep apnea     has o2 by bedside        Past Surgical History:   Procedure Laterality Date   . ARTHROPLASTY, ANKLE  03/30/2014    Procedure: ARTHROPLASTY, ANKLE;  Surgeon: Grace Bushy, DO;  Location: [place] MAIN OR;  Service: Orthopedics;  Laterality: Left;  Allograft ([place]ats)   . ARTHROSCOPY, ANKLE  03/30/2014    Procedure: ARTHROSCOPY, ANKLE;  Surgeon: Grace Bushy, DO;  Location: [place] [facility] OR;  Service: Orthopedics;  Laterality: Left;  LEFT ANKLE SCOPE WITH DEBRIDEMENT, BROSTROM REPAIR AND ARTHROPLASTY     . HERNIA REPAIR      inguinal   . ROTATOR CUFF REPAIR     . STABILIZATION, ANKLE, BROSTROM PROCEDURE  03/30/2014    Procedure: STABILIZATION, ANKLE, BROSTROM PROCEDURE;  Surgeon: Grace Bushy, DO;  Location: [place] [facility] OR;  Service: Orthopedics;  Laterality: Left;       History reviewed. No pertinent family history.    Social  Social History   Substance Use Topics   . Smoking status: Never Smoker   . Smokeless tobacco: Never Used   . Alcohol use No       .     Allergies   Allergen Reactions   . Midazolam Anxiety   . Advil [Ibuprofen]    . Nsaids Hives and Swelling   . Toradol [Ketorolac Tromethamine] Hives and  Swelling       Home Medications     Med List Status:  In Progress Set By: Lennox Pippins, RN at 06/02/2017  1:40 PM                ALBUTEROL IN     Inhale 1 application into the lungs as needed.     aluminum & magnesium / diphenhydramine / lidocaine / nystatin (MAGIC MOUTHWASH) suspension     Take 15 mLs by mouth 4 (four) times daily.     amLODIPine (NORVASC) 10 MG tablet     Take 10 mg by mouth daily.     guaiFENesin (MUCINEX) 600 MG 12 hr tablet     Take 1 tablet (600 mg total) by mouth 2 (two) times daily.     ondansetron (ZOFRAN-ODT) 4 MG disintegrating tablet     Take 1 tablet (4 mg total) by mouth every 6 (six) hours as needed for Nausea.  Review of Systems   Respiratory: Positive for shortness of breath.    Cardiovascular: Positive for chest pain.   Gastrointestinal: Positive for nausea.   All other systems reviewed and are negative.      Physical Exam    BP: 145/84, Heart Rate: (!) 110, Temp: 98.1 F (36.7 C), Resp Rate: 16, SpO2: 96 %, Weight: 86.2 kg    Physical Exam   Constitutional: He is oriented to person, place, and time. He appears well-developed and well-nourished.   Obese gentleman sitting on side of stretcher.  Spitting clear liquid into emesis bag which could be saliva.  Appearing somewhat uncomfortable but not toxic   HENT:   Head: Normocephalic and atraumatic.   Neck: Phonation normal.   Cardiovascular: Regular rhythm, S1 normal and S2 normal.  Tachycardia present.    Pulmonary/Chest: Effort normal and breath sounds normal. No stridor.   Abdominal: Soft. There is no tenderness.   Musculoskeletal: Normal range of motion.   Neurological: He is alert and oriented to person, place, and time. GCS eye subscore is 4. GCS verbal subscore is 5. GCS motor subscore is 6.   Skin: Skin is warm and dry.   Nursing note and vitals reviewed.    Home Medications      Home medications reviewed by ED MD     Discharge Medication List  as of 06/02/2017  2:50 PM      CONTINUE these medications which have NOT CHANGED    Details   ALBUTEROL IN Inhale 1 application into the lungs as needed., Until Discontinued, Historical Med      aluminum & magnesium / diphenhydramine / lidocaine / nystatin (MAGIC MOUTHWASH) suspension Take 15 mLs by mouth 4 (four) times daily., Starting Sun 06/02/2017, Print      amLODIPine (NORVASC) 10 MG tablet Take 10 mg by mouth daily., Until Discontinued, Historical Med      guaiFENesin (MUCINEX) 600 MG 12 hr tablet Take 1 tablet (600 mg total) by mouth 2 (two) times daily., Starting Sun 06/02/2017, Print      ondansetron (ZOFRAN-ODT) 4 MG disintegrating tablet Take 1 tablet (4 mg total) by mouth every 6 (six) hours as needed for Nausea., Starting Sun 06/02/2017, Print             Nurses notes including past history, allergies, vital signs reviewed.  Past Medical History:   Diagnosis Date   . Anxiety     due to sedation   . Asthma without status asthmaticus     rare use of rescue inhaler will bring   . Ear, nose and throat disorder     seasonal allergies   . Heartburn     prilosec   . Sleep apnea     has o2 by bedside      Past Surgical History:   Procedure Laterality Date   . ARTHROPLASTY, ANKLE  03/30/2014    Procedure: ARTHROPLASTY, ANKLE;  Surgeon: Grace Bushy, DO;  Location: [place] MAIN OR;  Service: Orthopedics;  Laterality: Left;  Allograft (Oats)   . ARTHROSCOPY, ANKLE  03/30/2014    Procedure: ARTHROSCOPY, ANKLE;  Surgeon: Grace Bushy, DO;  Location: [place] [facility]IN OR;  Service: Orthopedics;  Laterality: Left;  LEFT ANKLE SCOPE WITH DEBRIDEMENT, BROSTROM REPAIR AND ARTHROPLASTY     . HERNIA REPAIR      inguinal   . ROTATOR CUFF REPAIR     . STABILIZATION, ANKLE, BROSTROM PROCEDURE  03/30/2014    Procedure: STABILIZATION, ANKLE, BROSTROM PROCEDURE;  Surgeon: Grace Bushy, DO;  Location: [place] MAIN OR;  Service: Orthopedics;  Laterality: Left;     Allergies   Allergen Reactions   . Midazolam Anxiety   .  Advil [Ibuprofen]    . Nsaids Hives and Swelling   . Toradol [Ketorolac Tromethamine] Hives and Swelling       BP 145/84   Pulse 80   Temp 98.1 F (36.7 C) (Oral)   Resp 20   Ht 5\' 8"  (1.727 m)   Wt 86.2 kg   SpO2 94%   BMI 28.89 kg/m     O2 Sat in ED is 96% which is within normal limits.  Pt is being observed on continuous pulsox.    Patient with chest tightness and symptoms that he identifies as his ALLERGIC reaction.  Certainly could be ALLERGIC reaction and he did have ibuprofen earlier.  We will check some basic labs were otherwise treated for ALLERGIC reaction with antihistamine, steroids, and racemic epinephrine.  Monitor in the emergency department and can administer IM epinephrine as needed.    Patient Ricky Huffman was seen and treated by me in the ED.   Forest Gleason, MD  1:43 PM    Scribe and MD Attestations      I, Gloriajean Dell, am serving as a scribe to document services personally performed by Forest Gleason, MD, based on the provider's statements to me.     IForest Gleason, MD, personally performed the services documented. Gloriajean Dell is scribing for me on [facility]. I reviewed and confirm the accuracy of the information in this medical record.     Credentials: Gloriajean Dell, scribe     Rendering Provider: Forest Gleason, MD    EKG, Monitoring, and Splints     EKG (interpreted by ED physician):  EKG shows normal sinus rhythm @ 95bpm.  Normal intervals and conduction.  No ST-T abnormalities.  Normal axis.  Overall impression is normal EKG.      Splint Check: N/A    Diagnostic Study Results     The results of the diagnostic studies below were reviewed by the ED provider:    Results     Procedure Component Value Units Date/Time    Basic Metabolic Panel [[id number]]  (Abnormal) Collected:  06/02/17 1349    Specimen:  Blood Updated:  06/02/17 1415     Glucose 85 mg/dL      BUN 19 mg/dL      Creatinine 1.2 mg/dL      Calcium 9.3 mg/dL      Sodium 657 mEq/L      Potassium 4.0  mEq/L      Chloride 108 mEq/L      CO2 21 (L) mEq/L      Anion Gap 10.0    GFR [[id number]] Collected:  06/02/17 1349     Updated:  06/02/17 1415     EGFR >60.0    CBC with differential [[id number]] Collected:  06/02/17 1349    Specimen:  Blood from Blood Updated:  06/02/17 1357     WBC 9.75 x10 3/uL      Hgb 14.4 g/dL      Hematocrit 02.7 %      Platelets 191 x10 3/uL      RBC 5.09 x10 6/uL      MCV 85.3 fL      MCH 28.3 pg      MCHC 33.2 g/dL      RDW 13 %  MPV 9.4 fL      Neutrophils 62.8 %      Lymphocytes Automated 22.1 %      Monocytes 10.2 %      Eosinophils Automated 4.2 %      Basophils Automated 0.4 %      Immature Granulocyte 0.3 %      Nucleated RBC 0.0 /100 WBC      Neutrophils Absolute 6.13 x10 3/uL      Abs Lymph Automated 2.15 x10 3/uL      Abs Mono Automated 0.99 x10 3/uL      Abs Eos Automated 0.41 x10 3/uL      Absolute Baso Automated 0.04 x10 3/uL      Absolute Immature Granulocyte 0.03 x10 3/uL      Absolute NRBC 0.00 x10 3/uL           Radiology Results (24 Hour)     ** No results found for the last 24 hours. **          Clinical Notes       2:47PM Patient feeling better. No more nausea. Chest discomfort decreased markedly. Advised steroid affect will continue to build and last for a couple days. Can take OTC benadryl otherwise.     Critical Care     Total critical care time (excluding procedures): N/A            MDM and ED Course     ED Medication Orders     Start Ordered     Status Ordering Provider    06/02/17 1410 06/02/17 1409  cetirizine (ZyrTEC) tablet 10 mg  Once     Route: Oral  Ordered Dose: 10 mg     Last MAR action:  Given Imir Brumbach C    06/02/17 1348 06/02/17 1347  sodium chloride 0.9 % bolus 1,000 mL  Once     Route: Intravenous  Ordered Dose: 1,000 mL     Last MAR action:  New Bag Jowanna Loeffler C    06/02/17 1348 06/02/17 1347  dexamethasone (DECADRON) injection 10 mg  Once     Route: Intravenous  Ordered Dose: 10 mg     Last MAR action:  Given Freman Lapage C     06/02/17 1348 06/02/17 1347    Once     Route: Intravenous  Ordered Dose: 25 mg     Discontinued Aerin Delany C    06/02/17 1348 06/02/17 1347  racepinephrine (VAPONEFRIN) 2.25 % nebulizer solution 0.5 mL  RT - Once     Route: Nebulization  Ordered Dose: 0.5 mL     Last MAR action:  Given Rei Contee C    06/02/17 1348 06/02/17 1347  sodium chloride 0.9 % nebulizer solution 3 mL  RT - Once     Route: Nebulization  Ordered Dose: 3 mL     Last MAR action:  Given Jaquanda Wickersham C             MDM                 Procedures    Clinical Impression & Disposition     Clinical Impression  Final diagnoses:   Allergic reaction, initial encounter        ED Disposition     ED Disposition Condition Date/Time Comment    Discharge  Sun Jun 02, 2017  2:50 PM Eilam Concepcion discharge to home/self care.    Condition at disposition: Stable  Discharge Medication List as of 06/02/2017  2:50 PM           Treatment Team: Scribe: Imogene Burn, MD  06/02/17 [phone]

## 2017-06-02 NOTE — Discharge Instructions (Signed)
Pharyngitis, Viral    You have been diagnosed with viral pharyngitis. This is the medical term for a sore throat.    Viral pharyngitis is an infection in the back of your throat and tonsils caused by a virus. It may look and feel like a Strep throat, but it usually occurs with other cold symptoms like a runny nose, sinus congestion, cough, fever (temperature higher than 100.4F / 38C), or muscle aches. It is sometimes hard to tell a viral sore throat from Strep throat. Your doctor may do a "rapid strep test" or culture to see which kind of infection you have.    Symptoms include fever, sore throat, painful swallowing, and headache. You may also have symptoms of the common cold and notice swollen tender neck glands. You might have pus or white spots on your tonsils.    Viral pharyngitis is treated with medication for pain and fever. Antibiotics DO NOT cure viral infections and may cause side-effects, like diarrhea, nausea, abdominal cramps or allergic reactions. Using antibiotics when they are not necessary can lead to resistance, meaning antibiotics will not work when you need them in the future.    YOU SHOULD SEEK MEDICAL ATTENTION IMMEDIATELY, EITHER HERE OR AT THE NEAREST EMERGENCY DEPARTMENT, IF ANY OF THE FOLLOWING OCCURS:   You have trouble breathing.   Your voice changes or sounds hoarse.   Your throat pain gets worse or you have neck pain.   You cannot swallow liquids or medication.   You drool or cannot swallow saliva.   You feel worse or don't improve after 2 to 3 days.

## 2017-06-02 NOTE — EDIE (Signed)
Estephani Popper?NOTIFICATION?06/02/2017 13:22?Galbreath, Askia?MRN: [id number]    This patient has registered at the [facility] Emergency Department   For more information visit: [email or web address]   ED Care Guidelines  There are currently no ED Care Guidelines in [place] for this patient. Please check your facility's medical records system.  Care Providers  Karesha Trzcinski has no care providers on record at this time.   Recent Emergency Department Visit Summary  Admit Date Facility [facility] Type Major Type Diagnoses or Chief Complaint   Jun 02, 2017 [facility] [place] [facility] [place] [facility]  Emergency      Chest Pain      Jun 02, 2017 [facility] [place] [facility]  Emergency      ear pain and throat and fever      Otalgia      Sore Throat      Acute pharyngitis, unspecified      May 13, 2017 [facility] [facility] [place] [place] [facility]  Emergency      Essential (primary) hypertension      Chest pain, unspecified          Recent Inpatient Visit Summary  No recorded inpatient visits.     E.D. Visit Count (12 mo.)  Facility Visits   [facility] [place] 1   [facility] 1   [facility] 1   Total 3   Note: Visits indicate total known visits.      Prescription Monitoring Program  000??- Narcotic Use Score  000??- Sedative Use Score  000??- Stimulant Use Score  - All Scores range from 000-999 with 75% of the population scoring < 200 and on 1% scoring above 650  - The last digit of the narcotic, sedative, and stimulant score indicates the number of active prescriptions of that type  - Higher Use scores correlate with increased prescribers, pharmacies, mg equiv, and overlapping prescriptions   Concerning or unexpectedly high scores should prompt a review of the PMP record; this does not constitute checking PMP for prescribing purposes.    The above information is provided for the sole purpose of patient treatment. Use of  this information beyond the terms of Data Sharing Memorandum of Understanding and License Agreement is prohibited. In certain cases not all visits may be represented. Consult the aforementioned facilities for additional information.   ? 2018 [employer] - [place], [place] - [email or web address]

## 2017-06-02 NOTE — ED Triage Notes (Signed)
2 days of L ear/throat pain, temp up to 103 per pt report. Last dose of Tylenol 650mg  around 7AM today. Currently afebrile.

## 2017-06-02 NOTE — ED Provider Notes (Signed)
Physician/Midlevel provider first contact with patient: 06/02/17 1104         History     Chief Complaint   Patient presents with   . Otalgia   . Sore Throat     HPI     46 yo M w/ h/o seasonal allergies who presents w/ sore throat, L ear pain and malaise.  Patient states 2 weeks ago he was diagnosed w/ PNA and completed 1 week course of abx.  States symptoms resolved but notes onset of sore throat starting 3 days ago.  Minimal discomfort w/ swallowing.  Notes L ear pain.        Past Medical History:   Diagnosis Date   . Anxiety     due to sedation   . Asthma without status asthmaticus     rare use of rescue inhaler will bring   . Ear, nose and throat disorder     seasonal allergies   . Heartburn     prilosec   . Sleep apnea     has o2 by bedside        Past Surgical History:   Procedure Laterality Date   . ARTHROPLASTY, ANKLE  03/30/2014    Procedure: ARTHROPLASTY, ANKLE;  Surgeon: Grace Bushy, DO;  Location: [facility];  Service: Orthopedics;  Laterality: Left;  Allograft ([place])   . ARTHROSCOPY, ANKLE  03/30/2014    Procedure: ARTHROSCOPY, ANKLE;  Surgeon: Grace Bushy, DO;  Location: [facility];  Service: Orthopedics;  Laterality: Left;  LEFT ANKLE SCOPE WITH DEBRIDEMENT, BROSTROM REPAIR AND ARTHROPLASTY     . HERNIA REPAIR      inguinal   . ROTATOR CUFF REPAIR     . STABILIZATION, ANKLE, BROSTROM PROCEDURE  03/30/2014    Procedure: STABILIZATION, ANKLE, BROSTROM PROCEDURE;  Surgeon: Grace Bushy, DO;  Location: [facility];  Service: Orthopedics;  Laterality: Left;       No family history on file.    Social  Social History   Substance Use Topics   . Smoking status: Never Smoker   . Smokeless tobacco: Never Used   . Alcohol use No       .     Allergies   Allergen Reactions   . Midazolam Anxiety   . Advil [Ibuprofen]    . Nsaids Hives and Swelling   . Toradol [Ketorolac Tromethamine] Hives and Swelling       Home Medications     Med List Status:  In Progress Set By: Dava Najjar, RN at 06/02/2017 11:08 AM                ALBUTEROL IN     Inhale 1 application into the lungs as needed.     amLODIPine (NORVASC) 10 MG tablet     Take 10 mg by mouth daily.                                                                                 Review of Systems   Constitutional: Positive for fever.   HENT: Positive for congestion, ear pain, sinus pressure, sneezing and sore throat. Negative for drooling, ear  discharge and trouble swallowing.    Respiratory: Positive for cough. Negative for shortness of breath.    All other systems reviewed and are negative.      Physical Exam    BP: 144/83, Heart Rate: 76, Temp: 97.5 F (36.4 C), Resp Rate: 18, SpO2: 93 %, Weight: 86.2 kg    Physical Exam   Constitutional: He is oriented to person, place, and time. He appears well-developed and well-nourished. No distress.   Completely nontoxic   HENT:   Head: Normocephalic and atraumatic.   Right Ear: External ear normal.   Mouth/Throat: Oropharynx is clear and moist.   L Tm - ossicles well visualized, no effusion, L Tm slightly bulging   Eyes: Pupils are equal, round, and reactive to light.   Neck: Normal range of motion. Neck supple.   Cardiovascular: Normal rate, regular rhythm and normal heart sounds.    Pulmonary/Chest: Effort normal and breath sounds normal. No respiratory distress.   Abdominal: Soft. He exhibits no distension. There is no tenderness.   Musculoskeletal: Normal range of motion. He exhibits no edema or deformity.   Neurological: He is alert and oriented to person, place, and time. Coordination normal.   Skin: Skin is warm. Capillary refill takes less than 2 seconds.   Psychiatric: He has a normal mood and affect. His behavior is normal. Thought content normal.   Nursing note and vitals reviewed.        MDM and ED Course       Medical Decision Making      Presumptive Diagnosis: viral pharyngitis    Treatment Plan: home, see patient instructions for treatment and plan    I reviewed the vital signs,  nursing notes, past medical history, past surgical history, family history and social history.  No att. providers found    Vital Signs - BP 144/83   Pulse 76   Temp 97.5 F (36.4 C) (Tympanic)   Resp 18   Ht 5\' 8"  (1.727 m)   Wt 86.2 kg   SpO2 96%   BMI 28.89 kg/m     Pulse Oximetry Analysis -  Normal    Differential Diagnosis (not completely inclusive): pharyngitis viral vs bacterial, OM, influenza    Laboratory results reviewed by EDP: Yes  Radiologic study results reviewed by EDP: No    Radiologic Studies Interpreted (viewed) by EDP: No          ED Medication Orders     Start Ordered     Status Ordering Provider    06/02/17 1143 06/02/17 1142  ibuprofen (ADVIL,MOTRIN) tablet 800 mg  Once     Route: Oral  Ordered Dose: 800 mg     Last MAR action:  Given Ernesto Lashway TODD    06/02/17 1137 06/02/17 1136  guaiFENesin (ROBITUSSIN) oral solution 200 mg  Once     Route: Oral  Ordered Dose: 200 mg     Last MAR action:  Canceled Entry Hebe Merriwether TODD    06/02/17 1135 06/02/17 1134  lidocaine viscous (XYLOCAINE) 2 % solution 5 mL  Once     Route: Mouth/Throat  Ordered Dose: 5 mL     Last MAR action:  Given Brandyce Dimario TODD    06/02/17 1127 06/02/17 1126  acetaminophen (TYLENOL) tablet 1,000 mg  Once     Route: Oral  Ordered Dose: 1,000 mg     Last MAR action:  Given Texanna Hilburn TODD    06/02/17 1127 06/02/17 1126    Once  Route: Oral  Ordered Dose: 600 mg     Discontinued Brayden Betters TODD             MDM  Number of Diagnoses or Management Options  Viral pharyngitis:            Patient states he does not think he has ibuprofen allergy, completely confident he never had anaphylaxis.  Patient request ibuprofen instead of tylenol.      Procedures    Clinical Impression & Disposition     Clinical Impression  Final diagnoses:   Viral pharyngitis        ED Disposition     ED Disposition Condition Date/Time Comment    Discharge  Sun Jun 02, 2017 12:09 PM Ricky Huffman discharge to home/self  care.    Condition at disposition: Stable           Discharge Medication List as of 06/02/2017 12:09 PM      START taking these medications    Details   aluminum & magnesium / diphenhydramine / lidocaine / nystatin (MAGIC MOUTHWASH) suspension Take 15 mLs by mouth 4 (four) times daily., Starting Sun 06/02/2017, Print      guaiFENesin (MUCINEX) 600 MG 12 hr tablet Take 1 tablet (600 mg total) by mouth 2 (two) times daily., Starting Sun 06/02/2017, Print      ondansetron (ZOFRAN-ODT) 4 MG disintegrating tablet Take 1 tablet (4 mg total) by mouth every 6 (six) hours as needed for Nausea., Starting Sun 06/02/2017, Print                       Olevia Bowens, MD  06/02/17 1859

## 2017-06-02 NOTE — ED Notes (Signed)
Although NSAID is listed under allergy, pt requesting motrin. Pt states he has taken it before and has not had reaction. He thinks it could have been different medication. Will monitor patient for after administration per Dr. Izola Price.

## 2017-06-02 NOTE — ED Notes (Addendum)
No reactions to motrin given, pt denies itching/swelling.

## 2017-06-02 NOTE — Discharge Instructions (Signed)
Dear Mr. Ricky Huffman:    I appreciate your choosing the [facility] for your healthcare needs, and hope your visit today was EXCELLENT.    Instructions:  Return to the Emergency Department for any worsening symptoms or concerns.    Below is some information that our patients often find helpful.    We wish you good health and please do not hesitate to contact us if we can ever be of any assistance.    Sincerely,  Forest Gleason, MD  [facility]    ________________________________________________________________    If you do not continue to improve or your condition worsens, please contact your doctor or return immediately to the Emergency Department.    Thank you for choosing [facility] for your emergency care needs.  We strive to provide EXCELLENT care to you and your family.      DOCTOR REFERRALS  Call [phone] if you need any further referrals and we can help you find a primary care doctor or specialist.  Also, available online at:  [email or web address]    YOUR CONTACT INFORMATION  Before leaving please check with registration to make sure we have an up-to-date contact number.  You can call registration at [phone] to update your information.  For questions about your hospital bill, please call [phone].  For questions about your Emergency Dept Physician bill please call [phone].      FREE HEALTH SERVICES  If you need help with health or social services, please call 2-1-1 for a free referral to resources in your area.  2-1-1 is a free service connecting people with information on health insurance, free clinics, pregnancy, mental health, dental care, food assistance, housing, and substance abuse counseling.  Also, available online at:  http://www.[facility].org    MEDICAL RECORDS AND TESTS  Certain laboratory test results do not come back the same day, for example urine cultures.   We will contact you if other  important findings are noted.  Radiology films are often reviewed again to ensure accuracy.  If there is any discrepancy, we will notify you.      Please call [phone] to pick up a complimentary CD of any radiology studies performed.  If you or your doctor would like to request a copy of your medical records, please call [phone].      ORTHOPEDIC INJURY   Please know that significant injuries can exist even when an initial x-ray is read as normal or negative.  This can occur because some fractures (broken bones) are not initially visible on x-rays.  For this reason, close outpatient follow-up with your primary care doctor or bone specialist (orthopedist) is required.    MEDICATIONS AND FOLLOWUP  Please be aware that some prescription medications can cause drowsiness.  Use caution when driving or operating machinery.    The examination and treatment you have received in our Emergency Department is provided on an emergency basis, and is not intended to be a substitute for your primary care physician.  It is important that your doctor checks you again and that you report any new or remaining problems at that time.      24 HOUR PHARMACIES  [facility] - [address], [place], [place] [address] (1.4 miles, 7 minutes)  [facility] - [address] [place], [place] [address] (6.5 miles, 13 minutes)  Handout with directions available on request

## 2017-06-02 NOTE — ED Triage Notes (Signed)
Pt went to [facility] in [place] for a sore throat and was given an NSAID there. Pt is allergic to NSAIDS and started to get nausea and chst pain

## 2017-06-03 LAB — ECG 12-LEAD
Atrial Rate: 95 {beats}/min
P Axis: 80 degrees
P-R Interval: 144 ms
Q-T Interval: 328 ms
QRS Duration: 78 ms
QTC Calculation (Bezet): 412 ms
R Axis: 62 degrees
T Axis: 17 degrees
Ventricular Rate: 95 {beats}/min

## 2017-06-04 NOTE — Progress Notes (Signed)
Throat Culture               FINAL    06/04/17 18:59  06/04/17  No Beta Hemolytic Streptococcus Group A, C, or G isolated,       no further work.

## 2017-06-17 DIAGNOSIS — N529 Male erectile dysfunction, unspecified: Secondary | ICD-10-CM | POA: Insufficient documentation

## 2018-11-27 ENCOUNTER — Emergency Department: Payer: Self-pay

## 2018-11-27 ENCOUNTER — Other Ambulatory Visit: Payer: Self-pay

## 2018-11-27 ENCOUNTER — Emergency Department
Admission: EM | Admit: 2018-11-27 | Discharge: 2018-11-27 | Discharge status: Against Medical Advice | Payer: Self-pay | Attending: Emergency Medicine | Admitting: Emergency Medicine

## 2018-11-27 ENCOUNTER — Encounter: Payer: Self-pay | Admitting: Emergency Medicine

## 2018-11-27 DIAGNOSIS — Z5321 Procedure and treatment not carried out due to patient leaving prior to being seen by health care provider: Secondary | ICD-10-CM | POA: Insufficient documentation

## 2018-11-27 DIAGNOSIS — R202 Paresthesia of skin: Secondary | ICD-10-CM | POA: Insufficient documentation

## 2018-11-27 DIAGNOSIS — R079 Chest pain, unspecified: Secondary | ICD-10-CM | POA: Insufficient documentation

## 2018-11-27 DIAGNOSIS — M545 Low back pain: Secondary | ICD-10-CM | POA: Insufficient documentation

## 2018-11-27 DIAGNOSIS — R109 Unspecified abdominal pain: Secondary | ICD-10-CM | POA: Insufficient documentation

## 2018-11-27 HISTORY — DX: Essential (primary) hypertension: I10

## 2018-11-27 LAB — BASIC METABOLIC PANEL
Anion gap: 7 (ref 5–15)
BUN: 26 mg/dL — ABNORMAL HIGH (ref 6–20)
CO2: 26 mmol/L (ref 22–32)
Calcium: 9.5 mg/dL (ref 8.9–10.3)
Chloride: 103 mmol/L (ref 98–111)
Creatinine, Ser: 1.51 mg/dL — ABNORMAL HIGH (ref 0.61–1.24)
GFR calc Af Amer: >60 mL/min (ref >60)
GFR calc non Af Amer: 55 mL/min — ABNORMAL LOW (ref >60)
Glucose, Bld: 100 mg/dL — ABNORMAL HIGH (ref 70–99)
Potassium: 4.3 mmol/L (ref 3.5–5.1)
Sodium: 136 mmol/L (ref 135–145)

## 2018-11-27 LAB — CBC
HCT: 43.1 % (ref 39.0–52.0)
Hemoglobin: 13.9 g/dL (ref 13.0–17.0)
MCH: 28.3 pg (ref 26.0–34.0)
MCHC: 32.3 g/dL (ref 30.0–36.0)
MCV: 87.6 fL (ref 80.0–100.0)
Platelets: 207 10*3/uL (ref 150–400)
RBC: 4.92 MIL/uL (ref 4.22–5.81)
RDW: 13.2 % (ref 11.5–15.5)
WBC: 7.1 10*3/uL (ref 4.0–10.5)
nRBC: 0 % (ref 0.0–0.2)

## 2018-11-27 LAB — TROPONIN I: Troponin I: <0.03 ng/mL (ref <0.03)

## 2018-11-27 NOTE — ED Notes (Signed)
First Nurse Note: Patient complaining of chest pain X 1 hr.  Alert and oriented, color good, skin warm and dry.  Indicates central chest, complaining of numbness in his left arm and leg.  Accompanied by girlfriend.

## 2018-11-27 NOTE — ED Triage Notes (Signed)
Pt called from [facility] to treatment room, no response 

## 2018-11-27 NOTE — ED Triage Notes (Signed)
Has been having pain left lower abd/back.  Today has chest pain that he thinks it is like a pinched nerve.

## 2018-11-27 NOTE — ED Triage Notes (Signed)
Pt called from [facility] to room, no response 

## 2018-12-01 ENCOUNTER — Telehealth: Payer: Self-pay | Admitting: Emergency Medicine

## 2018-12-01 NOTE — Telephone Encounter (Signed)
Called patient due to lwot to inquire about condition and follow up plans.  No answer and no voicemail  

## 2018-12-30 ENCOUNTER — Emergency Department (HOSPITAL_COMMUNITY)
Admission: EM | Admit: 2018-12-30 | Discharge: 2018-12-30 | Disposition: A | Discharge status: Home / Self Care | Payer: Self-pay | Attending: Emergency Medicine | Admitting: Emergency Medicine

## 2018-12-30 ENCOUNTER — Other Ambulatory Visit: Payer: Self-pay

## 2018-12-30 ENCOUNTER — Emergency Department (HOSPITAL_COMMUNITY): Payer: Self-pay

## 2018-12-30 ENCOUNTER — Encounter (HOSPITAL_COMMUNITY): Payer: Self-pay | Admitting: Emergency Medicine

## 2018-12-30 DIAGNOSIS — I10 Essential (primary) hypertension: Secondary | ICD-10-CM | POA: Insufficient documentation

## 2018-12-30 DIAGNOSIS — R0602 Shortness of breath: Secondary | ICD-10-CM | POA: Insufficient documentation

## 2018-12-30 DIAGNOSIS — R55 Syncope and collapse: Secondary | ICD-10-CM | POA: Insufficient documentation

## 2018-12-30 DIAGNOSIS — R0600 Dyspnea, unspecified: Secondary | ICD-10-CM | POA: Insufficient documentation

## 2018-12-30 LAB — BASIC METABOLIC PANEL
Anion gap: 11 (ref 5–15)
BUN: 21 mg/dL — ABNORMAL HIGH (ref 6–20)
CO2: 25 mmol/L (ref 22–32)
Calcium: 9.7 mg/dL (ref 8.9–10.3)
Chloride: 100 mmol/L (ref 98–111)
Creatinine, Ser: 1.6 mg/dL — ABNORMAL HIGH (ref 0.61–1.24)
GFR calc Af Amer: 59 mL/min — ABNORMAL LOW (ref >60)
GFR calc non Af Amer: 51 mL/min — ABNORMAL LOW (ref >60)
Glucose, Bld: 106 mg/dL — ABNORMAL HIGH (ref 70–99)
Potassium: 4.1 mmol/L (ref 3.5–5.1)
Sodium: 136 mmol/L (ref 135–145)

## 2018-12-30 LAB — CBC
HCT: 44.1 % (ref 39.0–52.0)
Hemoglobin: 14 g/dL (ref 13.0–17.0)
MCH: 28.1 pg (ref 26.0–34.0)
MCHC: 31.7 g/dL (ref 30.0–36.0)
MCV: 88.6 fL (ref 80.0–100.0)
Platelets: 198 10*3/uL (ref 150–400)
RBC: 4.98 MIL/uL (ref 4.22–5.81)
RDW: 13 % (ref 11.5–15.5)
WBC: 7.5 10*3/uL (ref 4.0–10.5)
nRBC: 0 % (ref 0.0–0.2)

## 2018-12-30 LAB — D-DIMER, QUANTITATIVE: D-Dimer, Quant: 0.66 ug/mL-FEU — ABNORMAL HIGH (ref 0.00–0.50)

## 2018-12-30 LAB — TROPONIN I
Troponin I: <0.03 ng/mL (ref <0.03)
Troponin I: <0.03 ng/mL (ref <0.03)

## 2018-12-30 MED ORDER — CYCLOBENZAPRINE HCL 10 MG PO TABS: 10.0000 mg | ORAL_TABLET | Freq: Two times a day (BID) | ORAL | 0 refills | Status: AC | PRN

## 2018-12-30 MED ORDER — SODIUM CHLORIDE 0.9 % IV BOLUS
1000.0000 mL | Freq: Once | INTRAVENOUS | Status: AC
  Administered 2018-12-30: 1000 mL via INTRAVENOUS

## 2018-12-30 MED ORDER — SODIUM CHLORIDE 0.9% FLUSH: 3.0000 mL | Freq: Once | INTRAVENOUS | Status: DC

## 2018-12-30 MED ORDER — IOPAMIDOL (ISOVUE-370) INJECTION 76%
100.0000 mL | Freq: Once | INTRAVENOUS | Status: AC | PRN
  Administered 2018-12-30: 100 mL via INTRAVENOUS

## 2018-12-30 NOTE — ED Provider Notes (Signed)
[facility] EMERGENCY DEPARTMENT Provider Note   CSN: [id number] Arrival date & time: 12/30/18  1038     History   Chief Complaint Chief Complaint  Patient presents with  . Chest Pain    HPI Howard Maldonado is a 48 y.o. male.  Patient with history of high blood pressure presents with near syncope and shortness of breath.  Patient drove down from [place] as there was an issue with piping at the family home.  Patient was at [employer] getting parts with family member started feeling like he is in a pass out.  He also felt short of breath.  Patient had 3 cups of coffee on route because he did not sleep last night and had to drive all the way down.  He is never done this before.  Patient was having palpitations as well.  Patient denies cardiac or blood clot history.  No chest pain I clarified with him.  No diaphoresis.     Past Medical History:  Diagnosis Date  . Hypertension     There are no active problems to display for this patient.   Past Surgical History:  Procedure Laterality Date  . ANKLE FRACTURE SURGERY Left   . HERNIA REPAIR    . ROTATOR CUFF REPAIR Left         Home Medications    Prior to Admission medications   Medication Sig Start Date End Date Taking? Authorizing Provider  diphenhydrAMINE (BENADRYL) 25 MG tablet Take 25 mg by mouth every 8 (eight) hours as needed for allergies.  06/02/17  Yes [provider]  olmesartan-hydrochlorothiazide (BENICAR HCT) 40-25 MG tablet Take 1 tablet by mouth daily.   Yes [provider]    Family History No family history on file.  Social History Social History   Tobacco Use  . Smoking status: Never Smoker  . Smokeless tobacco: Never Used  Substance Use Topics  . Alcohol use: Yes    Comment: occ  . Drug use: Never     Allergies   Midazolam and Nsaids   Review of Systems Review of Systems  Constitutional: Negative for chills and fever.  HENT: Negative for congestion.   Eyes:  Negative for visual disturbance.  Respiratory: Positive for shortness of breath.   Cardiovascular: Positive for palpitations. Negative for chest pain and leg swelling.  Gastrointestinal: Negative for abdominal pain and vomiting.  Genitourinary: Negative for dysuria and flank pain.  Musculoskeletal: Negative for back pain, neck pain and neck stiffness.  Skin: Negative for rash.  Neurological: Positive for light-headedness. Negative for headaches.     Physical Exam Updated Vital Signs BP (!) 136/92 (BP Location: Right Arm)   Pulse 63   Temp (!) 97.3 F (36.3 C) (Oral)   Resp 18   Ht 5\' 8"  (1.727 m)   Wt 97.5 kg   SpO2 97%   BMI 32.69 kg/m   Physical Exam Vitals signs and nursing note reviewed.  Constitutional:      Appearance: He is well-developed.  HENT:     Head: Normocephalic and atraumatic.  Eyes:     General:        Right eye: No discharge.        Left eye: No discharge.     Conjunctiva/sclera: Conjunctivae normal.  Neck:     Musculoskeletal: Normal range of motion and neck supple.     Trachea: No tracheal deviation.  Cardiovascular:     Rate and Rhythm: Normal rate and regular rhythm.  Heart sounds: Normal heart sounds.  Pulmonary:     Effort: Pulmonary effort is normal.     Breath sounds: Normal breath sounds.  Abdominal:     General: There is no distension.     Palpations: Abdomen is soft.     Tenderness: There is no abdominal tenderness. There is no guarding.  Musculoskeletal:     Right lower leg: He exhibits no tenderness. No edema.     Left lower leg: He exhibits no tenderness. No edema.  Skin:    General: Skin is warm.     Findings: No rash.  Neurological:     Mental Status: He is alert and oriented to person, place, and time.      ED Treatments / Results  Labs (all labs ordered are listed, but only abnormal results are displayed) Labs Reviewed  BASIC METABOLIC PANEL - Abnormal; Notable for the following components:      Result Value    Glucose, Bld 106 (*)    BUN 21 (*)    Creatinine, Ser 1.60 (*)    GFR calc non Af Amer 51 (*)    GFR calc Af Amer 59 (*)    All other components within normal limits  D-DIMER, QUANTITATIVE (NOT AT [facility]) - Abnormal; Notable for the following components:   D-Dimer, Quant 0.66 (*)    All other components within normal limits  CBC  TROPONIN I  TROPONIN I    EKG EKG Interpretation  Date/Time:  Tuesday December 30 2018 10:47:20 EST Ventricular Rate:  62 PR Interval:  156 QRS Duration: 88 QT Interval:  380 QTC Calculation: 385 R Axis:   28 Text Interpretation:  Normal sinus rhythm Normal ECG Confirmed by Blane OharaZavitz, Amalea Ottey [phone]([id number]) on 12/30/2018 11:38:44 AM   Radiology Dg Chest 2 View  Result Date: 12/30/2018 CLINICAL DATA:  Chest pain. EXAM: CHEST - 2 VIEW COMPARISON:  None. FINDINGS: The heart size and mediastinal contours are within normal limits. Both lungs are clear. The visualized skeletal structures are unremarkable. IMPRESSION: Normal exam. Electronically Signed   By: Francene BoyersJames  Maxwell M.D.   On: 12/30/2018 11:19    Procedures Procedures (including critical care time)  Medications Ordered in ED Medications  sodium chloride flush (NS) 0.9 % injection 3 mL (3 mLs Intravenous Not Given 12/30/18 1227)  sodium chloride 0.9 % bolus 1,000 mL (1,000 mLs Intravenous New Bag/Given 12/30/18 1427)  iopamidol (ISOVUE-370) 76 % injection 100 mL (has no administration in time range)     Initial Impression / Assessment and Plan / ED Course  I have reviewed the triage vital signs and the nursing notes.  Pertinent labs & imaging results that were available during my care of the patient were reviewed by me and considered in my medical decision making (see chart for details).    Patient presents with near syncope and shortness of breath since 6-hour car ride.  Patient has no other risk factors for blood clots.  D-dimer sent and positive.  Plan for CT scan of the chest.  EKG no acute findings. Pt  low risk cardiac/ HEART, delta troponin plan.  On reassessment patient improved and overall feels well.  D-dimer mildly positive discussed risks and benefits of CT scan patient comfortable with CT angiogram.  Plan for delta troponin.  Patient's care will be signed out to follow-up CT results and second troponin with likely plan for close outpatient follow-up.   Final Clinical Impressions(s) / ED Diagnoses   Final diagnoses:  Near syncope  Acute  dyspnea    ED Discharge Orders    None       Blane OharaZavitz, Dondrell Loudermilk, MD 12/30/18 [phone]

## 2018-12-30 NOTE — ED Triage Notes (Signed)
CP in center of chest x 1 hour.  Called ems while in [place] but felt better so didn't go to the hospital.

## 2018-12-30 NOTE — ED Notes (Signed)
ED Provider at bedside. 

## 2018-12-30 NOTE — Discharge Instructions (Signed)
Have a repeat CT with your primary doctor to look at adrenal nodule.  If you were given medicines take as directed.  If you are on coumadin or contraceptives realize their levels and effectiveness is altered by many different medicines.  If you have any reaction (rash, tongues swelling, other) to the medicines stop taking and see a physician.    If your blood pressure was elevated in the ER make sure you follow up for management with a primary doctor or return for chest pain, shortness of breath or stroke symptoms.  Please follow up as directed and return to the ER or see a physician for new or worsening symptoms.  Thank you. Vitals:   12/30/18 1052  BP: (!) 136/92  Pulse: 63  Resp: 18  Temp: (!) 97.3 F (36.3 C)  TempSrc: Oral  SpO2: 97%  Weight: 97.5 kg  Height: 5\' 8"  (1.727 m)

## 2019-01-07 ENCOUNTER — Emergency Department (HOSPITAL_COMMUNITY)
Admission: EM | Admit: 2019-01-07 | Discharge: 2019-01-08 | Disposition: A | Discharge status: Home / Self Care | Payer: Self-pay | Attending: Emergency Medicine | Admitting: Emergency Medicine

## 2019-01-07 ENCOUNTER — Other Ambulatory Visit: Payer: Self-pay

## 2019-01-07 ENCOUNTER — Emergency Department (HOSPITAL_COMMUNITY): Payer: Self-pay

## 2019-01-07 ENCOUNTER — Encounter (HOSPITAL_COMMUNITY): Payer: Self-pay | Admitting: *Deleted

## 2019-01-07 DIAGNOSIS — Y999 Unspecified external cause status: Secondary | ICD-10-CM | POA: Insufficient documentation

## 2019-01-07 DIAGNOSIS — S29011A Strain of muscle and tendon of front wall of thorax, initial encounter: Secondary | ICD-10-CM | POA: Insufficient documentation

## 2019-01-07 DIAGNOSIS — I10 Essential (primary) hypertension: Secondary | ICD-10-CM | POA: Insufficient documentation

## 2019-01-07 DIAGNOSIS — Z79899 Other long term (current) drug therapy: Secondary | ICD-10-CM | POA: Insufficient documentation

## 2019-01-07 DIAGNOSIS — S20212A Contusion of left front wall of thorax, initial encounter: Secondary | ICD-10-CM | POA: Insufficient documentation

## 2019-01-07 DIAGNOSIS — W19XXXA Unspecified fall, initial encounter: Secondary | ICD-10-CM

## 2019-01-07 DIAGNOSIS — Y92019 Unspecified place in single-family (private) house as the place of occurrence of the external cause: Secondary | ICD-10-CM | POA: Insufficient documentation

## 2019-01-07 DIAGNOSIS — Y9389 Activity, other specified: Secondary | ICD-10-CM | POA: Insufficient documentation

## 2019-01-07 DIAGNOSIS — W11XXXA Fall on and from ladder, initial encounter: Secondary | ICD-10-CM | POA: Insufficient documentation

## 2019-01-07 NOTE — ED Provider Notes (Signed)
[facility] EMERGENCY DEPARTMENT Provider Note   CSN: [id number] Arrival date & time: 01/07/19  2200     History   Chief Complaint Chief Complaint  Patient presents with  . Fall    HPI Howard Maldonado is a 48 y.o. male.  Patient is a 48 year old male with history of hypertension.  He presents with complaints of fall.  He states he was on a ladder while remodeling a house.  He states he fell and landed on the left side of his chest.  He is complaining of pain in the left chest along with numbness in his right fingertips.  He denies any shoulder pain or neck pain.  He denies any abdominal pain.  The history is provided by the patient.  Fall  This is a new problem. The current episode started 1 to 2 hours ago. The problem occurs constantly. The problem has not changed since onset.Associated symptoms include chest pain. Pertinent negatives include no abdominal pain and no shortness of breath. Nothing aggravates the symptoms. Nothing relieves the symptoms. He has tried nothing for the symptoms.    Past Medical History:  Diagnosis Date  . Hypertension     There are no active problems to display for this patient.   Past Surgical History:  Procedure Laterality Date  . ANKLE FRACTURE SURGERY Left   . HERNIA REPAIR    . ROTATOR CUFF REPAIR Left         Home Medications    Prior to Admission medications   Medication Sig Start Date End Date Taking? Authorizing Provider  cyclobenzaprine (FLEXERIL) 10 MG tablet Take 1 tablet (10 mg total) by mouth 2 (two) times daily as needed for muscle spasms. 12/30/18   Bethann BerkshireZammit, Joseph, MD  diphenhydrAMINE (BENADRYL) 25 MG tablet Take 25 mg by mouth every 8 (eight) hours as needed for allergies.  06/02/17   [provider]  olmesartan-hydrochlorothiazide (BENICAR HCT) 40-25 MG tablet Take 1 tablet by mouth daily.    [provider]    Family History History reviewed. No pertinent family history.  Social History Social  History   Tobacco Use  . Smoking status: Never Smoker  . Smokeless tobacco: Never Used  Substance Use Topics  . Alcohol use: Yes    Comment: occ  . Drug use: Never     Allergies   Midazolam and Nsaids   Review of Systems Review of Systems  Respiratory: Negative for shortness of breath.   Cardiovascular: Positive for chest pain.  Gastrointestinal: Negative for abdominal pain.  All other systems reviewed and are negative.    Physical Exam Updated Vital Signs BP 130/90 (BP Location: Right Arm)   Pulse 63   Temp 98.1 F (36.7 C) (Oral)   Resp 20   SpO2 99%   Physical Exam Vitals signs and nursing note reviewed.  Constitutional:      General: He is not in acute distress.    Appearance: He is well-developed. He is not diaphoretic.  HENT:     Head: Normocephalic and atraumatic.  Neck:     Musculoskeletal: Normal range of motion and neck supple.  Cardiovascular:     Rate and Rhythm: Normal rate and regular rhythm.     Heart sounds: No murmur. No friction rub.  Pulmonary:     Effort: Pulmonary effort is normal. No respiratory distress.     Breath sounds: Normal breath sounds. No wheezing or rales.     Comments: There is tenderness to palpation to the left lateral  ribs.  There is no palpable abnormality.  There is no crepitus.  Breath sounds are clear and equal bilaterally. Abdominal:     General: Bowel sounds are normal. There is no distension.     Palpations: Abdomen is soft.     Tenderness: There is no abdominal tenderness.  Musculoskeletal: Normal range of motion.     Comments: The right upper extremity appears grossly normal.  Patient is able to flex, extend, and oppose all fingers.  Sensation is intact throughout the entire hand.  He reports a subjective feeling of numbness to the fingertips of his thumb, second finger, and third finger.  Skin:    General: Skin is warm and dry.  Neurological:     Mental Status: He is alert and oriented to person, place, and  time.     Coordination: Coordination normal.      ED Treatments / Results  Labs (all labs ordered are listed, but only abnormal results are displayed) Labs Reviewed - No data to display  EKG None  Radiology No results found.  Procedures Procedures (including critical care time)  Medications Ordered in ED Medications - No data to display   Initial Impression / Assessment and Plan / ED Course  I have reviewed the triage vital signs and the nursing notes.  Pertinent labs & imaging results that were available during my care of the patient were reviewed by me and considered in my medical decision making (see chart for details).  Patient presents here with complaints of pain in the right side of his chest after a fall from a ladder.  X-rays show no evidence for rib fracture or pneumothorax.  He also has multiple other complaints including numbness of his right thumb, second and third fingers, the etiology of which I am uncertain.  He did not injure his arm and has no neck pain.  He is also complaining of wheezing.  On exam I am unable to hear this.  He would like a refill of his inhaler.  Patient will be discharged with tramadol as needed for pain.  He is to follow-up with his primary doctor if not improving in the next week.  The patient believes he needs an MRI to evaluate his pectoral muscle, however I do not feel as though this is necessary at this time.  If he is not improving in the next week, he can discuss this with his doctor.  Final Clinical Impressions(s) / ED Diagnoses   Final diagnoses:  None    ED Discharge Orders    None       Geoffery Lyonselo, Kayle Correa, MD 01/08/19 [phone]

## 2019-01-07 NOTE — ED Triage Notes (Signed)
Pt states he fell off a ladder approximately 4-5 feet and is c/o pain to left side of his chest and leg; pt states he has a cough with wheezing; pt c/o numbness to his right hand fingertips

## 2019-01-08 MED ORDER — ALBUTEROL SULFATE HFA 108 (90 BASE) MCG/ACT IN AERS: 1.0000 | INHALATION_SPRAY | Freq: Four times a day (QID) | RESPIRATORY_TRACT | 0 refills | Status: AC | PRN

## 2019-01-08 MED ORDER — IBUPROFEN 400 MG PO TABS
ORAL_TABLET | ORAL | Status: AC
  Filled 2019-01-08: qty 1

## 2019-01-08 MED ORDER — TRAMADOL HCL 50 MG PO TABS: 50.0000 mg | ORAL_TABLET | Freq: Four times a day (QID) | ORAL | 0 refills | Status: DC | PRN

## 2019-01-08 MED ORDER — IBUPROFEN 400 MG PO TABS
400.0000 mg | ORAL_TABLET | Freq: Once | ORAL | Status: AC
  Administered 2019-01-08: 400 mg via ORAL

## 2019-01-08 MED ORDER — FLUTICASONE-SALMETEROL 45-21 MCG/ACT IN AERO: 2.0000 | INHALATION_SPRAY | Freq: Two times a day (BID) | RESPIRATORY_TRACT | 12 refills | Status: AC

## 2019-01-08 NOTE — ED Notes (Signed)
Patient complaining of respiratory (cold symptoms as well)

## 2019-01-08 NOTE — Discharge Instructions (Signed)
Ibuprofen 600 mg 3 times daily for the next 5 days.  Tramadol as prescribed as needed for pain not relieved with ibuprofen.  Follow-up with your primary doctor if symptoms or not improving in the next week to discuss physical therapy or possibly further imaging.

## 2019-10-25 ENCOUNTER — Emergency Department: Payer: 59

## 2019-10-25 ENCOUNTER — Other Ambulatory Visit: Payer: Self-pay

## 2019-10-25 ENCOUNTER — Emergency Department
Admission: EM | Admit: 2019-10-25 | Discharge: 2019-10-25 | Disposition: A | Discharge status: Home / Self Care | Payer: 59 | Attending: Emergency Medicine | Admitting: Emergency Medicine

## 2019-10-25 DIAGNOSIS — Y999 Unspecified external cause status: Secondary | ICD-10-CM | POA: Insufficient documentation

## 2019-10-25 DIAGNOSIS — X58XXXA Exposure to other specified factors, initial encounter: Secondary | ICD-10-CM | POA: Insufficient documentation

## 2019-10-25 DIAGNOSIS — M25561 Pain in right knee: Secondary | ICD-10-CM | POA: Diagnosis present

## 2019-10-25 DIAGNOSIS — Z79899 Other long term (current) drug therapy: Secondary | ICD-10-CM | POA: Insufficient documentation

## 2019-10-25 DIAGNOSIS — I10 Essential (primary) hypertension: Secondary | ICD-10-CM | POA: Insufficient documentation

## 2019-10-25 DIAGNOSIS — S76311A Strain of muscle, fascia and tendon of the posterior muscle group at thigh level, right thigh, initial encounter: Secondary | ICD-10-CM | POA: Diagnosis not present

## 2019-10-25 DIAGNOSIS — Y92838 Other recreation area as the place of occurrence of the external cause: Secondary | ICD-10-CM | POA: Diagnosis not present

## 2019-10-25 DIAGNOSIS — Y9389 Activity, other specified: Secondary | ICD-10-CM | POA: Diagnosis not present

## 2019-10-25 MED ORDER — HYDROCODONE-ACETAMINOPHEN 5-325 MG PO TABS: 1.0000 | ORAL_TABLET | ORAL | 0 refills | Status: DC | PRN

## 2019-10-25 NOTE — Discharge Instructions (Signed)
Please use crutches and knee immobilizer as needed for comfort or until you can walk without a limp.  You may take Tylenol and Norco as needed for pain.  If sitting or lying you may apply ice to the back of the right knee.  Call Dr. Clydell Hakim office tomorrow morning to schedule follow-up appoint.  Return to the ER for any increasing pain worsening symptoms or urgent changes in health.

## 2019-10-25 NOTE — ED Triage Notes (Signed)
Patient states that he jumped over a creek last night and now has pain to his right knee.

## 2019-10-25 NOTE — ED Provider Notes (Addendum)
[facility] EMERGENCY DEPARTMENT Provider Note   CSN: [id number] Arrival date & time: 10/25/19  [id number]     History   Chief Complaint Chief Complaint  Patient presents with  . Knee Pain    HPI Howard Maldonado is a 48 y.o. male presents to the emergency department for evaluation of right knee pain.  Patient suffered right knee pain around midnight tonight he was jumping a creek, landed on the other side on his feet, felt a pop behind his right knee and has had increasing pain since.  He denies any groin pain, buttocks pain or thigh pain.  His pain is all posterior long distal hamstrings.  He denies any instability.  He has been taking Tylenol with no improvement.  He has a hard time ambulating.  Denies any other injury to his body.     HPI  Past Medical History:  Diagnosis Date  . Hypertension     There are no active problems to display for this patient.   Past Surgical History:  Procedure Laterality Date  . ANKLE FRACTURE SURGERY Left   . HERNIA REPAIR    . ROTATOR CUFF REPAIR Left         Home Medications    Prior to Admission medications   Medication Sig Start Date End Date Taking? Authorizing Provider  albuterol (PROVENTIL HFA;VENTOLIN HFA) 108 (90 Base) MCG/ACT inhaler Inhale 1-2 puffs into the lungs every 6 (six) hours as needed for wheezing or shortness of breath. 01/08/19   Geoffery Lyonselo, Douglas, MD  cyclobenzaprine (FLEXERIL) 10 MG tablet Take 1 tablet (10 mg total) by mouth 2 (two) times daily as needed for muscle spasms. 12/30/18   Bethann BerkshireZammit, Joseph, MD  diphenhydrAMINE (BENADRYL) 25 MG tablet Take 25 mg by mouth every 8 (eight) hours as needed for allergies.  06/02/17   [provider]  fluticasone-salmeterol (ADVAIR HFA) 45-21 MCG/ACT inhaler Inhale 2 puffs into the lungs 2 (two) times daily. 01/08/19   Geoffery Lyonselo, Douglas, MD  HYDROcodone-acetaminophen (NORCO) 5-325 MG tablet Take 1 tablet by mouth every 4 (four) hours as needed for moderate pain.  10/25/19   Evon SlackGaines, Amare Bail C, PA-C  olmesartan-hydrochlorothiazide (BENICAR HCT) 40-25 MG tablet Take 1 tablet by mouth daily.    [provider]  traMADol (ULTRAM) 50 MG tablet Take 1 tablet (50 mg total) by mouth every 6 (six) hours as needed. 01/08/19   Geoffery Lyonselo, Douglas, MD    Family History No family history on file.  Social History Social History   Tobacco Use  . Smoking status: Never Smoker  . Smokeless tobacco: Never Used  Substance Use Topics  . Alcohol use: Yes    Comment: occ  . Drug use: Never     Allergies   Midazolam and Nsaids   Review of Systems Review of Systems  Constitutional: Negative for fever.  Respiratory: Negative for shortness of breath.   Cardiovascular: Negative for chest pain.  Musculoskeletal: Positive for arthralgias and gait problem. Negative for joint swelling, myalgias and neck pain.  Skin: Negative for wound.  Neurological: Negative for weakness and numbness.     Physical Exam Updated Vital Signs BP 135/82   Pulse 79   Temp 97.8 F (36.6 C) (Oral)   Resp 16   Ht 5\' 10"  (1.778 m)   Wt 90.7 kg   SpO2 97%   BMI 28.70 kg/m   Physical Exam Constitutional:      Appearance: He is well-developed.  HENT:     Head: Normocephalic and  atraumatic.  Eyes:     Conjunctiva/sclera: Conjunctivae normal.  Neck:     Musculoskeletal: Normal range of motion.  Cardiovascular:     Rate and Rhythm: Normal rate.  Pulmonary:     Effort: Pulmonary effort is normal. No respiratory distress.  Musculoskeletal:     Comments: Right lower Extremities: Examination of the right lower extremity reveals no bony abnormality, no edema, no effusion and no ecchymosis.  There is no valgus or varus abnormality.  The patient is non-tender along the lateral joint line, and is non-tender along the medial joint line.  Mildly tender along the distal hamstrings with no palpable defect or bruising.  The patient has full knee flexion and extension.  Moderate discomfort  with hyperflexion.  Unable to tolerate McMurray's test..  There is no retropatellar discomfort.  Patient is able to straight leg raise.  The patient has a negative patella stretch test.  The patient has a negative varus stress test and a negative valgus stress test, in looking for stability.  The patient has a negative Lachman's test.  Full hip range of motion with no discomfort.  Vascular: The patient has a negative Denna Haggard' test bilaterally.  The patient had a normal dorsalis pedis and posterior tibial pulse.  There is normal skin warmth.  There is normal capillary refill bilaterally.    Neurologic: The patient has a negative straight leg raise.  The patient has normal muscle strength testing for the quadriceps, calves, ankle dorsiflexion, ankle plantarflexion, and extensor hallicus longus.  The patient has sensation that is intact to light touch.    Skin:    General: Skin is warm.     Findings: No rash.  Neurological:     Mental Status: He is alert and oriented to person, place, and time.  Psychiatric:        Behavior: Behavior normal.        Thought Content: Thought content normal.      ED Treatments / Results  Labs (all labs ordered are listed, but only abnormal results are displayed) Labs Reviewed - No data to display  EKG None  Radiology Dg Knee Complete 4 Views Right  Result Date: 10/25/2019 CLINICAL DATA:  Twisting injury with pain medially. EXAM: RIGHT KNEE - COMPLETE 4+ VIEW COMPARISON:  None. FINDINGS: No acute fracture or dislocation. Joint spaces maintained. No joint effusion. IMPRESSION: No acute osseous abnormality. Electronically Signed   By: Jeronimo Greaves M.D.   On: 10/25/2019 08:12    Procedures Procedures (including critical care time)  Medications Ordered in ED Medications - No data to display   Initial Impression / Assessment and Plan / ED Course  I have reviewed the triage vital signs and the nursing notes.  Pertinent labs & imaging results that were  available during my care of the patient were reviewed by me and considered in my medical decision making (see chart for details).        48 year old male with acute right knee pain along the distal hamstrings region.  No palpable defect or bruising.  He is able to straight leg raise.  Hip moves well with no discomfort.  X-rays show no evidence of effusion or acute bony abnormality.  No ligamentous laxity on exam.  History and exam consistent with hamstring strain.  He is placed in a knee immobilizer and given crutches.  Is given Norco for pain.  He will continue with Tylenol.  He is unable take NSAIDs.  Follow-up with orthopedics.  He understands signs symptoms  return to ED for.  Final Clinical Impressions(s) / ED Diagnoses   Final diagnoses:  Acute pain of right knee  Hamstring strain, right, initial encounter    ED Discharge Orders         Ordered    HYDROcodone-acetaminophen (Wells) 5-325 MG tablet  Every 4 hours PRN     10/25/19 0810           Duanne Guess, PA-C 10/25/19 0815    Duanne Guess, PA-C 10/25/19 7096    Arta Silence, MD 10/25/19 1526

## 2019-10-25 NOTE — ED Notes (Signed)
NAD noted at time of D/C. Pt denies questions or concerns. Pt ambulatory to the lobby at this time. Pt refused wheelchair to the lobby.  

## 2019-10-26 ENCOUNTER — Other Ambulatory Visit: Payer: Self-pay | Admitting: Chiropractor

## 2019-10-26 ENCOUNTER — Ambulatory Visit
Admission: RE | Admit: 2019-10-26 | Discharge: 2019-10-26 | Disposition: A | Discharge status: Home / Self Care | Payer: 59 | Source: Ambulatory Visit | Attending: Chiropractor | Admitting: Chiropractor

## 2019-10-26 DIAGNOSIS — T148XXA Other injury of unspecified body region, initial encounter: Secondary | ICD-10-CM | POA: Diagnosis not present

## 2020-01-20 DIAGNOSIS — R7303 Prediabetes: Secondary | ICD-10-CM | POA: Insufficient documentation

## 2020-02-13 IMAGING — CT CT ANGIO CHEST
2 of 6 series · 19 of 46 positions shown · IV contrast (Isovue)
Comparison: Radiographs of same day.

CLINICAL DATA: Shortness of breath.  Back pain.

EXAM:
CT ANGIOGRAPHY CHEST WITH CONTRAST
TECHNIQUE: Multidetector CT imaging of the chest was performed using the
standard protocol during bolus administration of intravenous
contrast. Multiplanar CT image reconstructions and MIPs were
obtained to evaluate the vascular anatomy.
CONTRAST:  100mL JO2NPK-RJJ IOPAMIDOL (JO2NPK-RJJ) INJECTION 76%

[Series 5: thins · axial · 0.64mm/px · z∈[-100,+147]mm · 16 of 271 slices shown]
[im 12/271  lung]
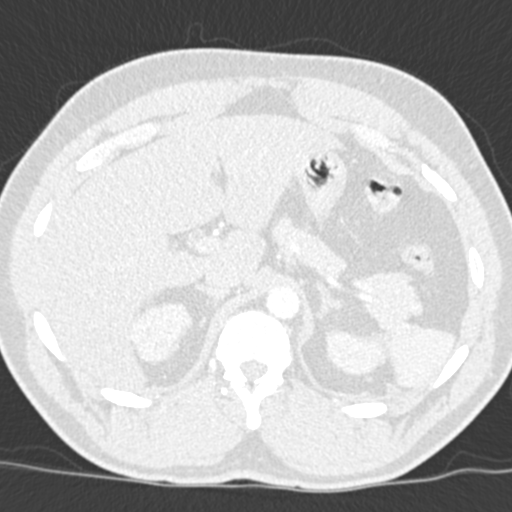
[im 36/271  soft-tissue]
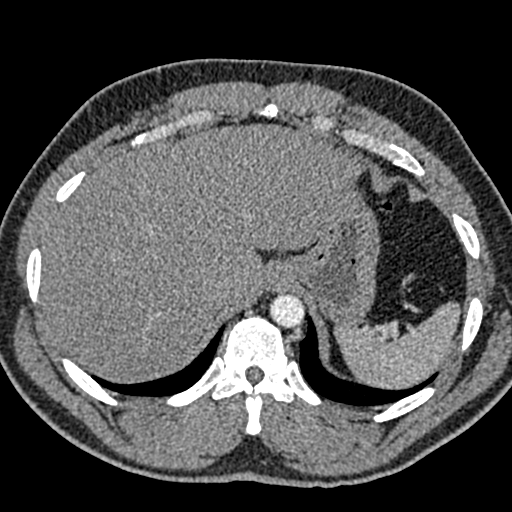
[im 47/271  lung]
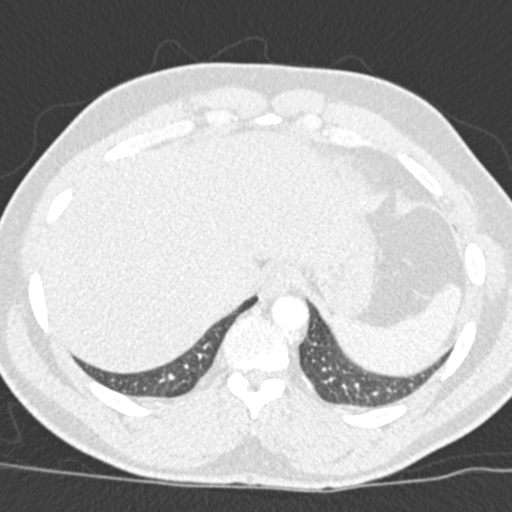
[im 59/271  soft-tissue]
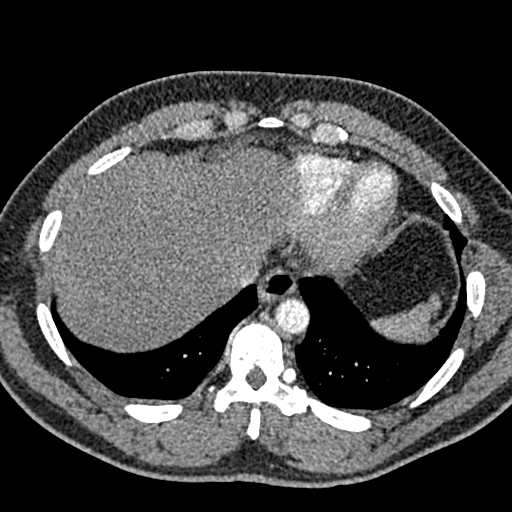
[im 83/271  lung]
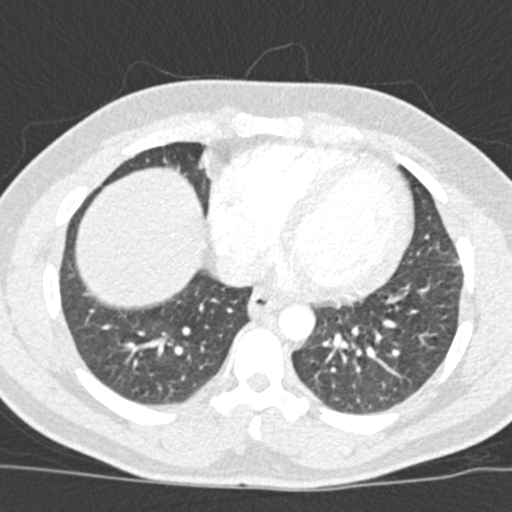
[im 94/271  soft-tissue]
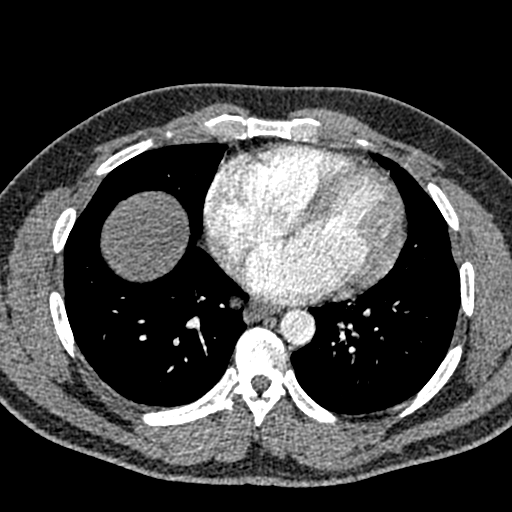
[im 106/271  lung]
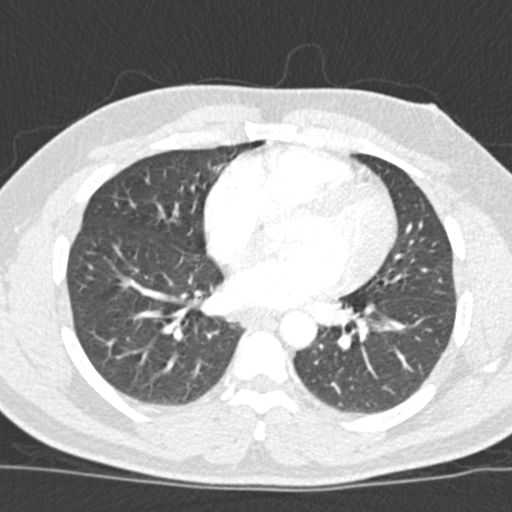
[im 130/271  soft-tissue]
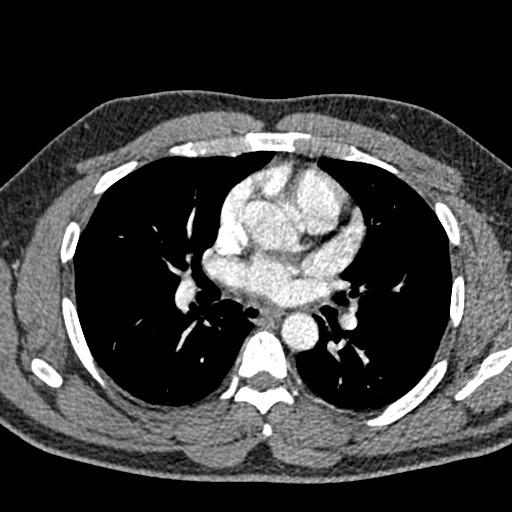
[im 141/271  lung]
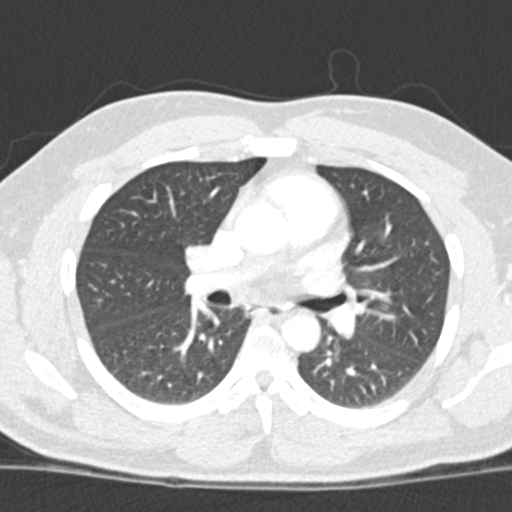
[im 165/271  soft-tissue]
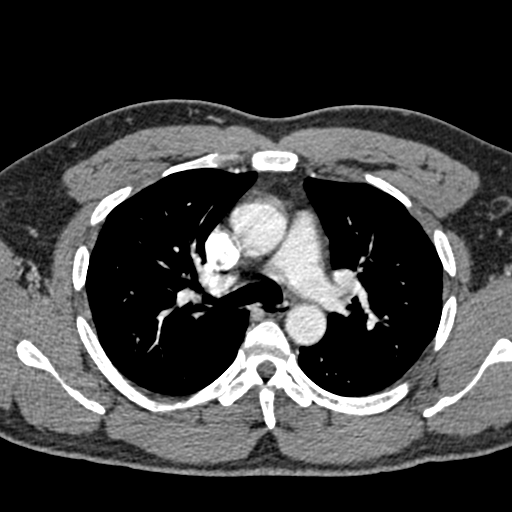
[im 177/271  lung]
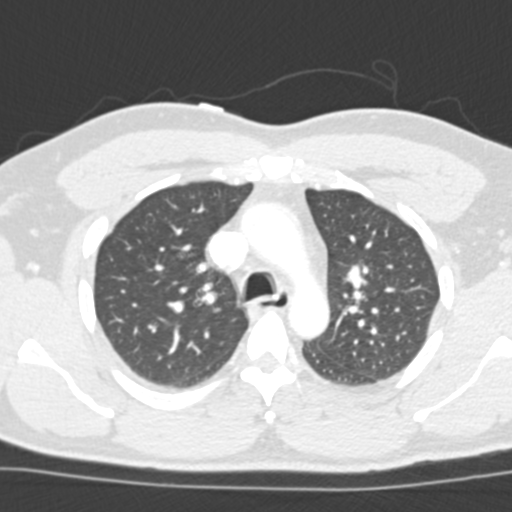
[im 188/271  soft-tissue]
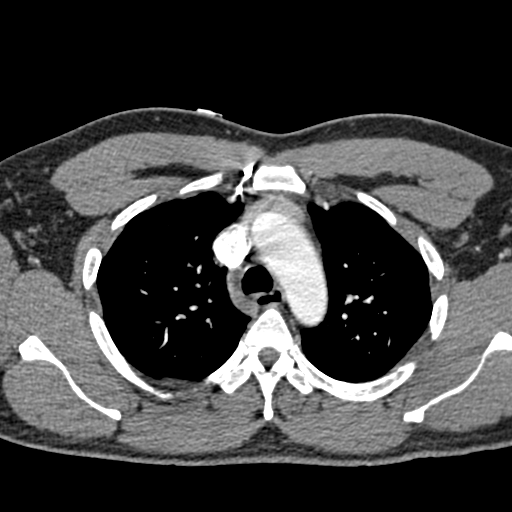
[im 212/271  lung]
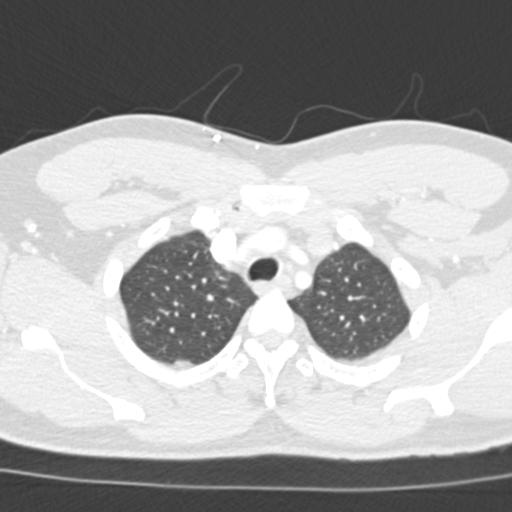
[im 224/271  soft-tissue]
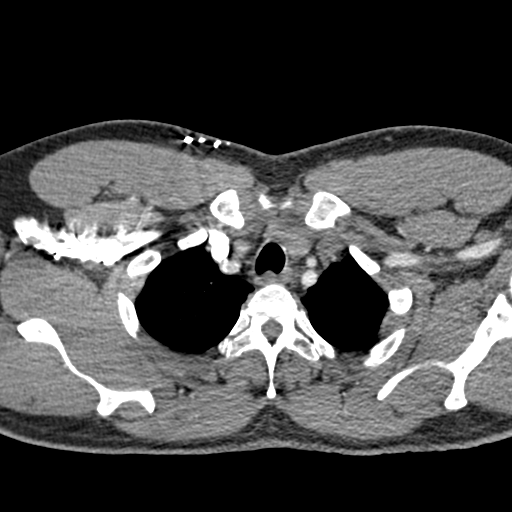
[im 235/271  lung]
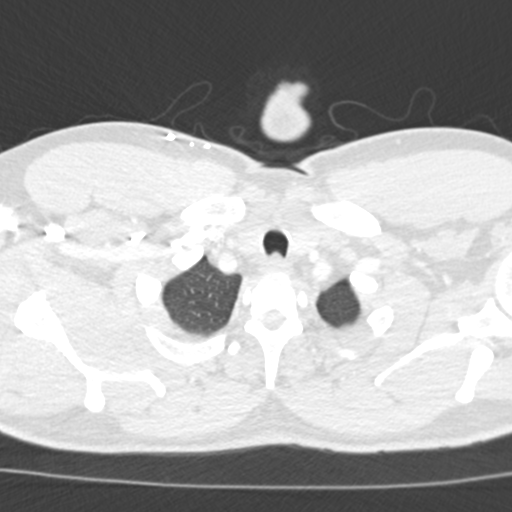
[im 259/271  soft-tissue]
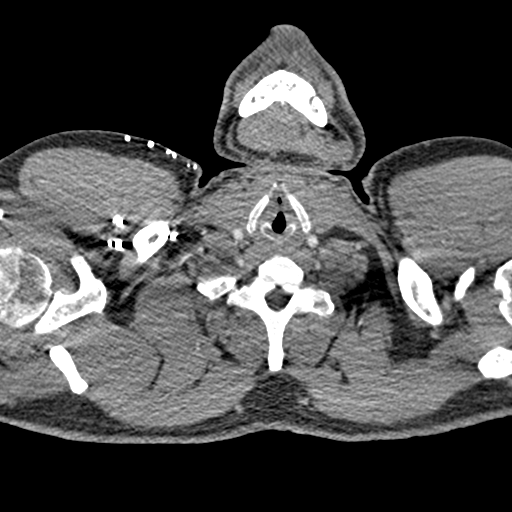

[Series 7: coronal mpr · coronal · 0.55mm/px · 3 of 151 slices shown]
[im 38/151  soft-tissue]
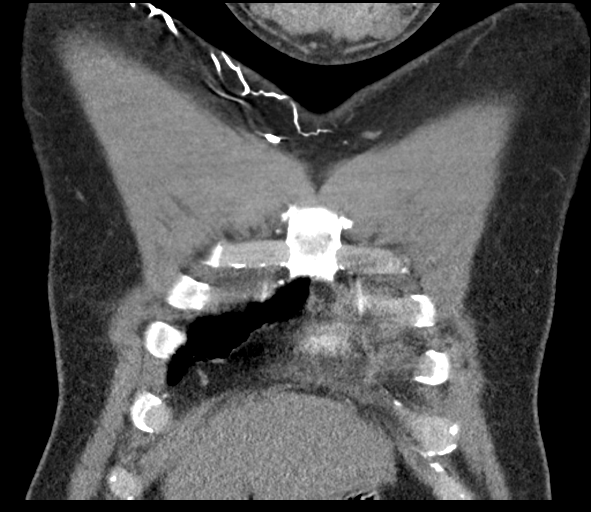
[im 76/151  soft-tissue]
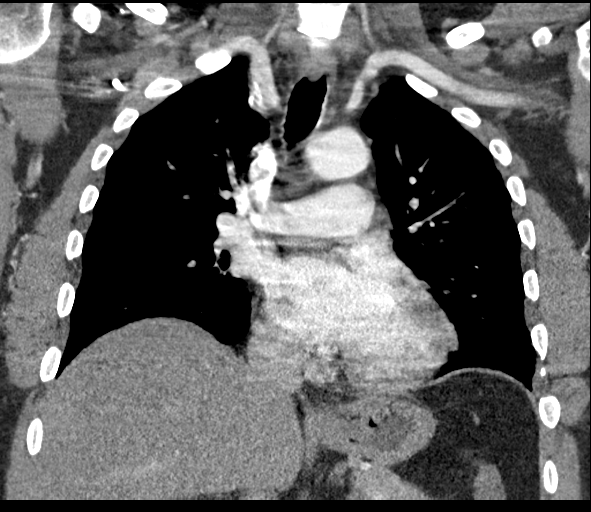
[im 113/151  soft-tissue]
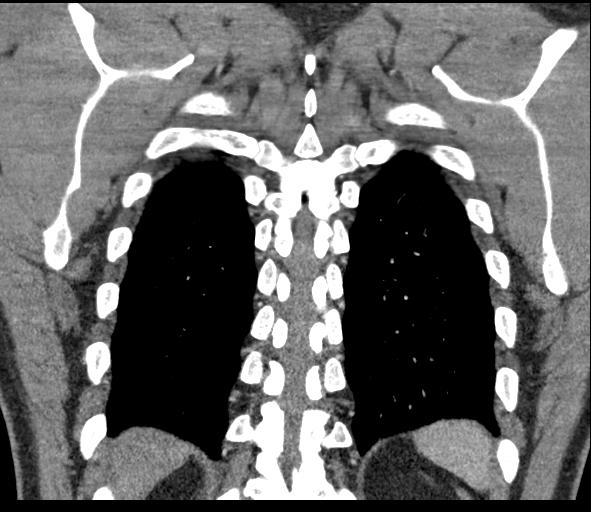

[19 of 46 positions shown; findings below may reference images not displayed]

FINDINGS: Cardiovascular: Satisfactory opacification of the pulmonary arteries
to the segmental level. No evidence of pulmonary embolism. Normal
heart size. No pericardial effusion. No evidence of thoracic aortic
dissection or aneurysm.

Mediastinum/Nodes: No enlarged mediastinal, hilar, or axillary lymph
nodes. Thyroid gland, trachea, and esophagus demonstrate no
significant findings.

Lungs/Pleura: Lungs are clear. No pleural effusion or pneumothorax.

Upper Abdomen: Possible 2.2 cm left adrenal nodule is noted.

Musculoskeletal: No chest wall abnormality. No acute or significant
osseous findings.

Review of the MIP images confirms the above findings.
IMPRESSION: No definite evidence of pulmonary embolus.

Possible 2.2 cm left adrenal nodule is noted which is incompletely
visualized. CT scan of the abdomen according to adrenal protocol is
recommended for further evaluation.

## 2020-03-05 DIAGNOSIS — E669 Obesity, unspecified: Secondary | ICD-10-CM | POA: Insufficient documentation

## 2020-03-05 DIAGNOSIS — N183 Chronic kidney disease, stage 3 unspecified: Secondary | ICD-10-CM | POA: Insufficient documentation

## 2020-07-13 ENCOUNTER — Other Ambulatory Visit: Payer: Self-pay

## 2020-07-13 ENCOUNTER — Emergency Department (HOSPITAL_COMMUNITY)
Admission: EM | Admit: 2020-07-13 | Discharge: 2020-07-14 | Disposition: A | Discharge status: Home / Self Care | Payer: 59 | Attending: Emergency Medicine | Admitting: Emergency Medicine

## 2020-07-13 ENCOUNTER — Emergency Department (HOSPITAL_COMMUNITY): Payer: 59

## 2020-07-13 ENCOUNTER — Encounter (HOSPITAL_COMMUNITY): Payer: Self-pay | Admitting: Emergency Medicine

## 2020-07-13 DIAGNOSIS — R079 Chest pain, unspecified: Secondary | ICD-10-CM | POA: Diagnosis not present

## 2020-07-13 DIAGNOSIS — Z20822 Contact with and (suspected) exposure to covid-19: Secondary | ICD-10-CM | POA: Diagnosis not present

## 2020-07-13 DIAGNOSIS — J0110 Acute frontal sinusitis, unspecified: Secondary | ICD-10-CM

## 2020-07-13 LAB — BASIC METABOLIC PANEL
Anion gap: 12 (ref 5–15)
BUN: 23 mg/dL — ABNORMAL HIGH (ref 6–20)
CO2: 24 mmol/L (ref 22–32)
Calcium: 9.7 mg/dL (ref 8.9–10.3)
Chloride: 100 mmol/L (ref 98–111)
Creatinine, Ser: 1.68 mg/dL — ABNORMAL HIGH (ref 0.61–1.24)
GFR calc Af Amer: 55 mL/min — ABNORMAL LOW (ref >60)
GFR calc non Af Amer: 47 mL/min — ABNORMAL LOW (ref >60)
Glucose, Bld: 100 mg/dL — ABNORMAL HIGH (ref 70–99)
Potassium: 4 mmol/L (ref 3.5–5.1)
Sodium: 136 mmol/L (ref 135–145)

## 2020-07-13 LAB — CBC
HCT: 42.2 % (ref 39.0–52.0)
Hemoglobin: 13.5 g/dL (ref 13.0–17.0)
MCH: 29.2 pg (ref 26.0–34.0)
MCHC: 32 g/dL (ref 30.0–36.0)
MCV: 91.1 fL (ref 80.0–100.0)
Platelets: 210 10*3/uL (ref 150–400)
RBC: 4.63 MIL/uL (ref 4.22–5.81)
RDW: 13.2 % (ref 11.5–15.5)
WBC: 10.4 10*3/uL (ref 4.0–10.5)
nRBC: 0 % (ref 0.0–0.2)

## 2020-07-13 LAB — TROPONIN I (HIGH SENSITIVITY)
Troponin I (High Sensitivity): 6 ng/L (ref <18)
Troponin I (High Sensitivity): 8 ng/L (ref <18)

## 2020-07-13 NOTE — ED Triage Notes (Signed)
Pt c/o left sided chest pain and exertional shortness of breath that started today. Denies cough, fever, headache. Fully vaccinated for covid, denies covid contacts.

## 2020-07-13 NOTE — ED Provider Notes (Signed)
Patient placed in Quick Look pathway, seen and evaluated   Chief Complaint: CP; SOB  HPI:   CP and SOB, started several hours ago, started walking up the steps and bending over. States his finger tips are numb. Endorses congestion; coughing up phlegm for the last 9 days. In [place] 3 days, had chills. COVID vaccinated. CP lelft sided.   ROS: CP, SOB on exertion   Physical Exam:   Gen: No distress  Neuro: Awake and Alert  Skin: Warm    Focused Exam: Patient nondiaphoretic, speaking full sentences without increased work of breathing.  Overall well-appearing.  Resting comfortably in the ER chair.   Initiation of care has begun. The patient has been counseled on the process, plan, and necessity for staying for the completion/evaluation, and the remainder of the medical screening examination    Mare Ferrari, PA-C 07/13/20 1758    Charlynne Pander, MD 07/13/20 2239

## 2020-07-14 LAB — SARS CORONAVIRUS 2 BY RT PCR (HOSPITAL ORDER, PERFORMED IN ~~LOC~~ HOSPITAL LAB): SARS Coronavirus 2: NEGATIVE

## 2020-07-14 MED ORDER — DEXAMETHASONE 4 MG PO TABS
10.0000 mg | ORAL_TABLET | Freq: Once | ORAL | Status: AC
  Administered 2020-07-14: 10 mg via ORAL
  Filled 2020-07-14: qty 3

## 2020-07-14 MED ORDER — AMOXICILLIN 500 MG PO CAPS
1000.0000 mg | ORAL_CAPSULE | Freq: Once | ORAL | Status: AC
  Administered 2020-07-14: 1000 mg via ORAL
  Filled 2020-07-14: qty 2

## 2020-07-14 NOTE — ED Notes (Addendum)
Pt called three no answer

## 2020-07-14 NOTE — ED Provider Notes (Signed)
[facility] EMERGENCY DEPARTMENT Provider Note   CSN: [id number] Arrival date & time: 07/13/20  1740     History Chief Complaint  Patient presents with  . Chest Pain  . Shortness of Breath    Howard Maldonado is a 49 y.o. male.  Patient to ED for evaluation of left sided chest tightness, exertional SOB, nasal congestion, sore throat, generalized body aches for the past 2 weeks. It started while at [facility] on the 29th of August. He reports having a negative COVID test 3 days later and he is COVID vaccinated. No nausea or vomiting. He reports chills without known fever.   The history is provided by the patient and the spouse.  Chest Pain Associated symptoms: fatigue and shortness of breath   Associated symptoms: no nausea   Shortness of Breath Associated symptoms: chest pain and sore throat        Past Medical History:  Diagnosis Date  . Hypertension     There are no problems to display for this patient.   Past Surgical History:  Procedure Laterality Date  . ANKLE FRACTURE SURGERY Left   . HERNIA REPAIR    . ROTATOR CUFF REPAIR Left        No family history on file.  Social History   Tobacco Use  . Smoking status: Never Smoker  . Smokeless tobacco: Never Used  Substance Use Topics  . Alcohol use: Yes    Comment: occ  . Drug use: Never    Home Medications Prior to Admission medications   Medication Sig Start Date End Date Taking? Authorizing Provider  olmesartan-hydrochlorothiazide (BENICAR HCT) 40-25 MG tablet Take 1 tablet by mouth daily.   Yes [provider]  albuterol (PROVENTIL HFA;VENTOLIN HFA) 108 (90 Base) MCG/ACT inhaler Inhale 1-2 puffs into the lungs every 6 (six) hours as needed for wheezing or shortness of breath. 01/08/19   Geoffery Lyons, MD  cyclobenzaprine (FLEXERIL) 10 MG tablet Take 1 tablet (10 mg total) by mouth 2 (two) times daily as needed for muscle spasms. 12/30/18   Bethann Berkshire, MD  diphenhydrAMINE  (BENADRYL) 25 MG tablet Take 25 mg by mouth every 8 (eight) hours as needed for allergies.  06/02/17   [provider]  fluticasone-salmeterol (ADVAIR HFA) 45-21 MCG/ACT inhaler Inhale 2 puffs into the lungs 2 (two) times daily. 01/08/19   Geoffery Lyons, MD  HYDROcodone-acetaminophen (NORCO) 5-325 MG tablet Take 1 tablet by mouth every 4 (four) hours as needed for moderate pain. 10/25/19   Evon Slack, PA-C  traMADol (ULTRAM) 50 MG tablet Take 1 tablet (50 mg total) by mouth every 6 (six) hours as needed. 01/08/19   Geoffery Lyons, MD    Allergies    Midazolam and Nsaids  Review of Systems   Review of Systems  Constitutional: Positive for chills and fatigue.  HENT: Positive for congestion and sore throat.   Respiratory: Positive for shortness of breath.   Cardiovascular: Positive for chest pain.  Gastrointestinal: Negative for nausea.  Musculoskeletal: Positive for myalgias.    Physical Exam Updated Vital Signs BP (!) 145/84   Pulse 77   Temp 97.9 F (36.6 C) (Oral)   Resp 14   SpO2 94%   Physical Exam Vitals and nursing note reviewed.  Constitutional:      Appearance: He is well-developed.  HENT:     Head: Normocephalic.     Right Ear: Tympanic membrane normal.     Left Ear: Tympanic membrane normal.  Mouth/Throat:     Mouth: Mucous membranes are moist.     Pharynx: Posterior oropharyngeal erythema present. No oropharyngeal exudate.  Eyes:     Conjunctiva/sclera: Conjunctivae normal.  Cardiovascular:     Rate and Rhythm: Normal rate and regular rhythm.     Heart sounds: No murmur heard.   Pulmonary:     Effort: Pulmonary effort is normal.     Breath sounds: Normal breath sounds. No wheezing, rhonchi or rales.  Abdominal:     General: Bowel sounds are normal.     Palpations: Abdomen is soft.     Tenderness: There is no abdominal tenderness. There is no guarding or rebound.  Musculoskeletal:        General: Normal range of motion.     Cervical back:  Normal range of motion and neck supple.     Right lower leg: No edema.     Left lower leg: No edema.  Skin:    General: Skin is warm and dry.  Neurological:     Mental Status: He is alert and oriented to person, place, and time.     ED Results / Procedures / Treatments   Labs (all labs ordered are listed, but only abnormal results are displayed) Labs Reviewed  BASIC METABOLIC PANEL - Abnormal; Notable for the following components:      Result Value   Glucose, Bld 100 (*)    BUN 23 (*)    Creatinine, Ser 1.68 (*)    GFR calc non Af Amer 47 (*)    GFR calc Af Amer 55 (*)    All other components within normal limits  SARS CORONAVIRUS 2 BY RT PCR (HOSPITAL ORDER, PERFORMED IN [facility])  CBC  TROPONIN I (HIGH SENSITIVITY)  TROPONIN I (HIGH SENSITIVITY)    EKG EKG Interpretation  Date/Time:  Wednesday July 13 2020 17:49:11 EDT Ventricular Rate:  84 PR Interval:  144 QRS Duration: 76 QT Interval:  350 QTC Calculation: 413 R Axis:   -54 Text Interpretation: Normal sinus rhythm Left anterior fascicular block Abnormal ECG No significant change since last tracing Confirmed by Ross Marcus ([id number]) on 07/14/2020 5:18:26 AM   Radiology DG Chest 2 View  Result Date: 07/13/2020 CLINICAL DATA:  Chest pain EXAM: CHEST - 2 VIEW COMPARISON:  01/08/2019 FINDINGS: Cardiac size and pulmonary vascularity normal. Lungs well aerated and clear without infiltrate effusion or mass. No skeletal abnormality. No interval change. IMPRESSION: No active cardiopulmonary disease. Electronically Signed   By: Marlan Palau M.D.   On: 07/13/2020 19:09    Procedures Procedures (including critical care time)  Medications Ordered in ED Medications  amoxicillin (AMOXIL) capsule 1,000 mg (has no administration in time range)    ED Course  I have reviewed the triage vital signs and the nursing notes.  Pertinent labs & imaging results that were available during my care of the patient  were reviewed by me and considered in my medical decision making (see chart for details).    MDM Rules/Calculators/A&P                          Patient to ED for evaluation of chest pain, sinus symptoms, SOB, body aches x 2 weeks.   Labs essentially unremarkable including troponin x 2. Cr. 1.68, stable. CXR negative.   Given duration of symptoms, will treat with abx. COVID pending. Doubt ACS.   Final Clinical Impression(s) / ED Diagnoses Final diagnoses:  None  1. Frontal sinusitis  Rx / DC Orders ED Discharge Orders    None       Elpidio Anis, PA-C 07/21/20 8841    Shon Baton, MD 07/21/20 [phone]

## 2020-07-14 NOTE — Discharge Instructions (Addendum)
Take Amoxil for the full 10 day prescription. You can also use a steroid nasal inhaler like Flonase or Nasacort, and plain saline nasal spray. Mucinex for help with cough. Tylenol and/or ibuprofen for aches and any fever/chills.   Your COVID test will result in the next 2-3 hours and can be reviewed in MyChart.   Return to the ED as needed if symptoms worsen.

## 2020-07-15 ENCOUNTER — Telehealth: Payer: Self-pay | Admitting: Surgery

## 2020-07-15 ENCOUNTER — Telehealth: Payer: Self-pay | Admitting: *Deleted

## 2020-07-15 NOTE — Telephone Encounter (Signed)
Pt called regarding which pharmacy Rx was e-scribed to.  RNCM reviewed chart to access After Visit Summary and did not find location of Rx.  Reached out to EDP for assistance with this matter.

## 2020-07-15 NOTE — Telephone Encounter (Signed)
ED CM received a call from patient concerning not receiving his discharge prescriptions. CM reviewed record, and deferred to EDP. Prescriptions were ordered Amoxicillin 500mg  PO TID x7 days and Prednisone 40mg  PO daily x5 days and [facility] into [facility] [address]. Updated patient and spouse, No further ED CM needs

## 2021-02-08 ENCOUNTER — Emergency Department (HOSPITAL_COMMUNITY): Payer: 59

## 2021-02-08 ENCOUNTER — Emergency Department (HOSPITAL_COMMUNITY)
Admission: EM | Admit: 2021-02-08 | Discharge: 2021-02-08 | Disposition: A | Discharge status: Home / Self Care | Payer: 59 | Attending: Emergency Medicine | Admitting: Emergency Medicine

## 2021-02-08 ENCOUNTER — Encounter (HOSPITAL_COMMUNITY): Payer: Self-pay | Admitting: Emergency Medicine

## 2021-02-08 DIAGNOSIS — R079 Chest pain, unspecified: Secondary | ICD-10-CM | POA: Diagnosis not present

## 2021-02-08 DIAGNOSIS — Z5321 Procedure and treatment not carried out due to patient leaving prior to being seen by health care provider: Secondary | ICD-10-CM | POA: Diagnosis not present

## 2021-02-08 LAB — CBC
HCT: 40.1 % (ref 39.0–52.0)
Hemoglobin: 13 g/dL (ref 13.0–17.0)
MCH: 28.9 pg (ref 26.0–34.0)
MCHC: 32.4 g/dL (ref 30.0–36.0)
MCV: 89.1 fL (ref 80.0–100.0)
Platelets: 195 10*3/uL (ref 150–400)
RBC: 4.5 MIL/uL (ref 4.22–5.81)
RDW: 13.3 % (ref 11.5–15.5)
WBC: 7.8 10*3/uL (ref 4.0–10.5)
nRBC: 0 % (ref 0.0–0.2)

## 2021-02-08 LAB — BASIC METABOLIC PANEL
Anion gap: 7 (ref 5–15)
BUN: 18 mg/dL (ref 6–20)
CO2: 25 mmol/L (ref 22–32)
Calcium: 9.4 mg/dL (ref 8.9–10.3)
Chloride: 103 mmol/L (ref 98–111)
Creatinine, Ser: 1.49 mg/dL — ABNORMAL HIGH (ref 0.61–1.24)
GFR, Estimated: 58 mL/min — ABNORMAL LOW (ref >60)
Glucose, Bld: 97 mg/dL (ref 70–99)
Potassium: 4.1 mmol/L (ref 3.5–5.1)
Sodium: 135 mmol/L (ref 135–145)

## 2021-02-08 LAB — TROPONIN I (HIGH SENSITIVITY): Troponin I (High Sensitivity): 10 ng/L (ref <18)

## 2021-02-08 NOTE — ED Notes (Signed)
Pt came up this tech stating he wanted to leave this tech encouraged pt to stay but pt sated he had made an appointment wit is pcp and really wanted to leave this tech removed pts iv which was clean dry an intact,

## 2021-02-08 NOTE — ED Triage Notes (Signed)
Patient BIB [facility] for 7/10 chest pain and shortness of breath with radiation to left arm that started at approximately 0730 this morning. Refused aspirin and nitroglycerin with EMS. Also reports headache. Patient alert, oriented, and in no apparent distress at this time.

## 2021-02-09 ENCOUNTER — Encounter (HOSPITAL_COMMUNITY): Payer: Self-pay | Admitting: Emergency Medicine

## 2022-06-19 ENCOUNTER — Encounter: Payer: Self-pay | Admitting: Medical Oncology

## 2022-06-19 ENCOUNTER — Emergency Department
Admission: EM | Admit: 2022-06-19 | Discharge: 2022-06-19 | Disposition: A | Discharge status: Home / Self Care | Payer: Self-pay | Attending: Emergency Medicine | Admitting: Emergency Medicine

## 2022-06-19 ENCOUNTER — Emergency Department: Payer: Self-pay

## 2022-06-19 ENCOUNTER — Other Ambulatory Visit: Payer: Self-pay

## 2022-06-19 DIAGNOSIS — M5412 Radiculopathy, cervical region: Secondary | ICD-10-CM | POA: Insufficient documentation

## 2022-06-19 DIAGNOSIS — M25522 Pain in left elbow: Secondary | ICD-10-CM | POA: Insufficient documentation

## 2022-06-19 DIAGNOSIS — X500XXA Overexertion from strenuous movement or load, initial encounter: Secondary | ICD-10-CM | POA: Insufficient documentation

## 2022-06-19 DIAGNOSIS — R079 Chest pain, unspecified: Secondary | ICD-10-CM | POA: Insufficient documentation

## 2022-06-19 DIAGNOSIS — Y92511 Restaurant or cafe as the place of occurrence of the external cause: Secondary | ICD-10-CM | POA: Insufficient documentation

## 2022-06-19 DIAGNOSIS — Y990 Civilian activity done for income or pay: Secondary | ICD-10-CM | POA: Insufficient documentation

## 2022-06-19 LAB — CBC
HCT: 44.2 % (ref 39.0–52.0)
Hemoglobin: 14.3 g/dL (ref 13.0–17.0)
MCH: 28.1 pg (ref 26.0–34.0)
MCHC: 32.4 g/dL (ref 30.0–36.0)
MCV: 87 fL (ref 80.0–100.0)
Platelets: 209 10*3/uL (ref 150–400)
RBC: 5.08 MIL/uL (ref 4.22–5.81)
RDW: 13.2 % (ref 11.5–15.5)
WBC: 7.1 10*3/uL (ref 4.0–10.5)
nRBC: 0 % (ref 0.0–0.2)

## 2022-06-19 LAB — BASIC METABOLIC PANEL
Anion gap: 5 (ref 5–15)
BUN: 16 mg/dL (ref 6–20)
CO2: 25 mmol/L (ref 22–32)
Calcium: 9.1 mg/dL (ref 8.9–10.3)
Chloride: 107 mmol/L (ref 98–111)
Creatinine, Ser: 1.23 mg/dL (ref 0.61–1.24)
GFR, Estimated: >60 mL/min (ref >60)
Glucose, Bld: 101 mg/dL — ABNORMAL HIGH (ref 70–99)
Potassium: 3.7 mmol/L (ref 3.5–5.1)
Sodium: 137 mmol/L (ref 135–145)

## 2022-06-19 LAB — TROPONIN I (HIGH SENSITIVITY): Troponin I (High Sensitivity): 9 ng/L (ref <18)

## 2022-06-19 MED ORDER — LIDOCAINE 5 % EX PTCH
1.0000 | MEDICATED_PATCH | CUTANEOUS | Status: DC
  Administered 2022-06-19: 1 via TRANSDERMAL
  Filled 2022-06-19: qty 1

## 2022-06-19 MED ORDER — ACETAMINOPHEN 500 MG PO TABS
1000.0000 mg | ORAL_TABLET | Freq: Once | ORAL | Status: AC
  Administered 2022-06-19: 1000 mg via ORAL
  Filled 2022-06-19: qty 2

## 2022-06-19 NOTE — ED Triage Notes (Signed)
Pt reports left sided chest pressure that began last night. Pt denies sob at the moment.

## 2022-06-19 NOTE — ED Notes (Signed)
See triage note  Presents with some chest discomfort/pressure  States sxs' started last pm Denies any cough fever or SOB

## 2022-06-19 NOTE — ED Provider Notes (Signed)
[facility] Provider Note    Event Date/Time   First MD Initiated Contact with Patient 06/19/22 [phone]     (approximate)   History   Chest Pain   HPI  Howard Maldonado is a 51 y.o. male with a past medical history of high blood pressure presents for evaluation of some left-sided chest pain that started yesterday.  Patient does note he was doing some heavy lifting yesterday as he works at [employer].  This in the setting of approximately 3 weeks of pain and weakness in the left elbow and some tingling in his right fingertips.  He states that around 3 weeks ago he stumbled and fell onto his left elbow and since then has had fairly significant pain and weakness here.  No significant pain in the wrist or shoulder.  No significant decrease sensation in the left hand.  He denies any fevers, cough, abdominal pain, nausea, vomiting or diarrhea or urinary symptoms.  No headache, LOC and is not on any blood thinners.  Endorses intermittent neck discomfort.  No other extremity pain.  No other recent trauma.  He denies tobacco use EtOH use or illicit drug use.    Past Medical History:  Diagnosis Date   Hypertension      Physical Exam  Triage Vital Signs: ED Triage Vitals  Enc Vitals Group     BP 06/19/22 0811 121/89     Pulse Rate 06/19/22 0811 72     Resp 06/19/22 0811 17     Temp 06/19/22 0811 98.5 F (36.9 C)     Temp Source 06/19/22 0811 Oral     SpO2 06/19/22 0811 97 %     Weight 06/19/22 0806 216 lb 0.8 oz (98 kg)     Height 06/19/22 0806 5\' 8"  (1.727 m)     Head Circumference --      Peak Flow --      Pain Score 06/19/22 0806 7     Pain Loc --      Pain Edu? --      Excl. in [place]? --     Most recent vital signs: Vitals:   06/19/22 0811  BP: 121/89  Pulse: 72  Resp: 17  Temp: 98.5 F (36.9 C)  SpO2: 97%    General: Awake, no distress.  CV:  Good peripheral perfusion.  2+ radial pulses.  No significant murmur.  Tenderness palpation of the left  costochondral joints. Resp:  Normal effort.  Clear bilaterally. Abd:  No distention.  Other:  No tenderness or step-offs over the C/T/L-spine.  Patient does have some mild tenderness of posterior aspect of left elbow and does seem weaker on flexion extension compared to the right elbow possibly secondary to pain.  There is no large deformity.  He has no pain decreased range of motion or tenderness about the bilateral shoulders or wrist.  No snuffbox tenderness.  Sensation is intact in distribution of the radial ulnar median nerves in the bilateral upper extremities, patient states feels slightly less to light touch in the right upper extremity in distribution of the radial nerve.  No other trauma to the face scalp head or neck.   ED Results / Procedures / Treatments  Labs (all labs ordered are listed, but only abnormal results are displayed) Labs Reviewed  BASIC METABOLIC PANEL - Abnormal; Notable for the following components:      Result Value   Glucose, Bld 101 (*)    All other components within normal  limits  CBC  TROPONIN I (HIGH SENSITIVITY)     EKG  EKG is remarkable for sinus rhythm with a ventricular rate of 69 with nonspecific ST change versus artifact in lead III without any other clear evidence of acute ischemia or significant arrhythmia.   RADIOLOGY Chest reviewed by myself shows no focal consoidation, effusion, edema, pneumothorax or other clear acute thoracic process. I also reviewed radiology interpretation and agree with findings described.  CT C-spine my interpretation without evidence of fracture or traumatic subluxation or other clear acute process.  Also reviewed radiology interpretation.  X-ray of the left elbow on my interpretation without evidence of fracture or dislocation.  I also reviewed radiology's interpretation.   PROCEDURES:  Critical Care performed: No  Procedures    MEDICATIONS ORDERED IN ED: Medications  lidocaine (LIDODERM) 5 % 1 patch (1  patch Transdermal Patch Applied 06/19/22 0931)  acetaminophen (TYLENOL) tablet 1,000 mg (1,000 mg Oral Given 06/19/22 0930)     IMPRESSION / MDM / ASSESSMENT AND PLAN / ED COURSE  I reviewed the triage vital signs and the nursing notes. Patient's presentation is most consistent with acute presentation with potential threat to life or bodily function.                               With regard to patient's chest pain differential diagnosis includes, but is not limited to ACS, GERD, esophageal spasm, MSK, bronchitis, pneumonia, pneumothorax.  His intermittent discomfort in his neck pain and weakness in his left elbow as well as paresthesias in the right upper extremity I suspect are likely result of his fall reports a couple weeks ago with some residual cervical radiculopathy in the right upper extremity and possibly contusion versus fracture and possible bursitis in the left elbow.  He is reporting some intermittent neck discomfort and normally does not have any C-spine tenderness to obtain a CT C-spine as well.  EKG and nonelevated troponin obtained greater than 3 hours after symptom onset and not suggestive of ACS.  Patient's chest pain is reproducible I suspect this is some delayed muscle soreness from heavy lifting yesterday.  BMP without any significant electrolyte or metabolic derangements.  CBC without leukocytosis or acute anemia.  CT C-spine my interpretation without evidence of fracture or traumatic subluxation or other clear acute process.  Also reviewed radiology interpretation.  X-ray of the left elbow on my interpretation without evidence of fracture or dislocation.  I also reviewed radiology's interpretation.  Chest reviewed by myself shows no focal consoidation, effusion, edema, pneumothorax or other clear acute thoracic process. I also reviewed radiology interpretation and agree with findings described.  I suspect contusion and possible tendinitis in the left elbow as well as some  mild cervical radiculopathy in the tips of the fingers in the right upper extremity.  His chest pain is very reproducible and more suggestive of MSK etiology at this time and other immediate life-threatening process.  At this point I do not believe he requires further emergency department evaluation but I discussed with him importance of close outpatient PCP follow-up.  Advised only doing range of motion exercises left elbow and using Tylenol every 6 hours for pain using ice and elevation as well.  He says he cannot take NSAIDs will defer this.  Discussed returning for any worsening of symptoms.  Discharged in stable condition.  Strict return precautions advised and discussed.     FINAL CLINICAL IMPRESSION(S) / ED  DIAGNOSES   Final diagnoses:  Chest pain, unspecified type  Left elbow pain  Cervical radiculopathy     Rx / DC Orders   ED Discharge Orders     None        Note:  This document was prepared using Dragon voice recognition software and may include unintentional dictation errors.   Gilles Chiquito, MD 06/19/22 [phone]

## 2022-12-26 DIAGNOSIS — R079 Chest pain, unspecified: Secondary | ICD-10-CM | POA: Diagnosis not present

## 2022-12-26 DIAGNOSIS — N2889 Other specified disorders of kidney and ureter: Secondary | ICD-10-CM | POA: Diagnosis not present

## 2022-12-26 DIAGNOSIS — J9811 Atelectasis: Secondary | ICD-10-CM | POA: Diagnosis not present

## 2022-12-26 DIAGNOSIS — R1031 Right lower quadrant pain: Secondary | ICD-10-CM | POA: Diagnosis not present

## 2022-12-26 DIAGNOSIS — E278 Other specified disorders of adrenal gland: Secondary | ICD-10-CM | POA: Diagnosis not present

## 2022-12-27 DIAGNOSIS — R1031 Right lower quadrant pain: Secondary | ICD-10-CM | POA: Diagnosis not present

## 2022-12-27 DIAGNOSIS — N2889 Other specified disorders of kidney and ureter: Secondary | ICD-10-CM | POA: Diagnosis not present

## 2022-12-27 DIAGNOSIS — E278 Other specified disorders of adrenal gland: Secondary | ICD-10-CM | POA: Diagnosis not present

## 2022-12-27 DIAGNOSIS — R079 Chest pain, unspecified: Secondary | ICD-10-CM | POA: Diagnosis not present

## 2023-01-15 DIAGNOSIS — D3502 Benign neoplasm of left adrenal gland: Secondary | ICD-10-CM | POA: Insufficient documentation

## 2023-01-15 DIAGNOSIS — E278 Other specified disorders of adrenal gland: Secondary | ICD-10-CM | POA: Insufficient documentation

## 2023-01-28 ENCOUNTER — Encounter: Payer: Self-pay | Admitting: Emergency Medicine

## 2023-01-28 ENCOUNTER — Emergency Department: Payer: 59

## 2023-01-28 ENCOUNTER — Emergency Department
Admission: EM | Admit: 2023-01-28 | Discharge: 2023-01-28 | Disposition: A | Discharge status: Home / Self Care | Payer: 59 | Attending: Emergency Medicine | Admitting: Emergency Medicine

## 2023-01-28 ENCOUNTER — Other Ambulatory Visit: Payer: Self-pay

## 2023-01-28 DIAGNOSIS — R079 Chest pain, unspecified: Secondary | ICD-10-CM | POA: Diagnosis present

## 2023-01-28 DIAGNOSIS — R0789 Other chest pain: Secondary | ICD-10-CM | POA: Diagnosis not present

## 2023-01-28 DIAGNOSIS — R0600 Dyspnea, unspecified: Secondary | ICD-10-CM | POA: Insufficient documentation

## 2023-01-28 LAB — CBC
HCT: 41.6 % (ref 39.0–52.0)
Hemoglobin: 13.7 g/dL (ref 13.0–17.0)
MCH: 28.6 pg (ref 26.0–34.0)
MCHC: 32.9 g/dL (ref 30.0–36.0)
MCV: 86.8 fL (ref 80.0–100.0)
Platelets: 206 10*3/uL (ref 150–400)
RBC: 4.79 MIL/uL (ref 4.22–5.81)
RDW: 12.9 % (ref 11.5–15.5)
WBC: 7.7 10*3/uL (ref 4.0–10.5)
nRBC: 0 % (ref 0.0–0.2)

## 2023-01-28 LAB — BASIC METABOLIC PANEL
Anion gap: 9 (ref 5–15)
BUN: 26 mg/dL — ABNORMAL HIGH (ref 6–20)
CO2: 24 mmol/L (ref 22–32)
Calcium: 9.2 mg/dL (ref 8.9–10.3)
Chloride: 100 mmol/L (ref 98–111)
Creatinine, Ser: 1.53 mg/dL — ABNORMAL HIGH (ref 0.61–1.24)
GFR, Estimated: 55 mL/min — ABNORMAL LOW (ref >60)
Glucose, Bld: 140 mg/dL — ABNORMAL HIGH (ref 70–99)
Potassium: 3.9 mmol/L (ref 3.5–5.1)
Sodium: 133 mmol/L — ABNORMAL LOW (ref 135–145)

## 2023-01-28 LAB — TROPONIN I (HIGH SENSITIVITY): Troponin I (High Sensitivity): 7 ng/L (ref <18)

## 2023-01-28 MED ORDER — PANTOPRAZOLE SODIUM 40 MG PO TBEC: 40.0000 mg | DELAYED_RELEASE_TABLET | Freq: Every day | ORAL | 0 refills | Status: AC

## 2023-01-28 NOTE — ED Triage Notes (Signed)
Patient to ED for left sided CP that started yesterday. Patient states pain radiates into left arm with intermittent numbness- no numbness at this time. Patient also c/o inguinal hernia pain. Denies cardiac hx.

## 2023-01-28 NOTE — ED Provider Notes (Signed)
[facility] Provider Note    Event Date/Time   First MD Initiated Contact with Patient 01/28/23 [phone]     (approximate)   History   Chest Pain   HPI  Howard Maldonado is a 52 y.o. male who presents with complaints of chest discomfort.  Patient reports that he has been having significant indigestion followed by chest discomfort, frequently this occurs at night.  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  Denies a history of heart disease.  Does not smoke.  No calf pain or swelling.  No fevers chills or cough.  No shortness of breath     Physical Exam   Triage Vital Signs: ED Triage Vitals  Enc Vitals Group     BP 01/28/23 0906 127/86     Pulse Rate 01/28/23 0906 92     Resp 01/28/23 0906 18     Temp 01/28/23 0906 97.6 F (36.4 C)     Temp Source 01/28/23 0906 Oral     SpO2 01/28/23 0906 97 %     Weight 01/28/23 0931 98 kg (216 lb 0.8 oz)     Height 01/28/23 0931 1.727 m ('5\' 8"'$ )     Head Circumference --      Peak Flow --      Pain Score 01/28/23 0903 7     Pain Loc --      Pain Edu? --      Excl. in [place]? --     Most recent vital signs: Vitals:   01/28/23 0906 01/28/23 1032  BP: 127/86 122/80  Pulse: 92 88  Resp: 18 18  Temp: 97.6 F (36.4 C)   SpO2: 97% 98%     General: Awake, no distress.  CV:  Good peripheral perfusion.  No murmur, regular rate and rhythm Resp:  Normal effort.  Clear to auscultation bilaterally Abd:  No distention.  Other:  No calf pain or swelling   ED Results / Procedures / Treatments   Labs (all labs ordered are listed, but only abnormal results are displayed) Labs Reviewed  BASIC METABOLIC PANEL - Abnormal; Notable for the following components:      Result Value   Sodium 133 (*)    Glucose, Bld 140 (*)    BUN 26 (*)    Creatinine, Ser 1.53 (*)    GFR, Estimated 55 (*)    All other components within normal limits  CBC  TROPONIN I (HIGH SENSITIVITY)  TROPONIN I (HIGH  SENSITIVITY)     EKG  ED ECG REPORT I, Lavonia Drafts, the attending physician, personally viewed and interpreted this ECG.  Date: 01/28/2023  Rhythm: normal sinus rhythm QRS Axis: normal Intervals: normal ST/T Wave abnormalities: normal Narrative Interpretation: no evidence of acute ischemia    RADIOLOGY Chest x-ray viewed interpreted by me, no acute abnormality    PROCEDURES:  Critical Care performed:   Procedures   MEDICATIONS ORDERED IN ED: Medications - No data to display   IMPRESSION / MDM / [place] / ED COURSE  I reviewed the triage vital signs and the nursing notes. Patient's presentation is most consistent with acute presentation with potential threat to life or bodily function.  Patient presents with chest discomfort as detailed above.  Currently he is chest pain-free.  His EKG is quite reassuring, high sensitive troponin is normal, lab work reviewed demonstrates mild elevation of BUN/creatinine consistent with mild dehydration, CBC is normal.  Differential includes ACS  which I find unlikely, GERD/esophagitis which is far more likely.  Will treat with PPI but also refer to cardiology given that he is a 52 year old male with atypical chest discomfort for further evaluation, patient agrees with this plan        FINAL CLINICAL IMPRESSION(S) / ED DIAGNOSES   Final diagnoses:  Atypical chest pain  Dyspnea, unspecified type     Rx / DC Orders   ED Discharge Orders          Ordered    pantoprazole (PROTONIX) 40 MG tablet  Daily        01/28/23 1021    Ambulatory referral to Cardiology        01/28/23 1021             Note:  This document was prepared using Dragon voice recognition software and may include unintentional dictation errors.   Lavonia Drafts, MD 01/28/23 [phone]

## 2023-01-28 NOTE — ED Notes (Signed)
See triage note  Presents with left upper chest pain which started last pm    last only couple of hours  then returned this am   Pain increases with movement  No fever, cough or SOB   Also had some numbness in left arm

## 2023-02-18 ENCOUNTER — Ambulatory Visit: Payer: 59 | Attending: Cardiology | Admitting: Cardiology

## 2023-02-18 ENCOUNTER — Other Ambulatory Visit
Admission: RE | Admit: 2023-02-18 | Discharge: 2023-02-18 | Disposition: A | Discharge status: Home / Self Care | Payer: 59 | Source: Ambulatory Visit | Attending: Cardiology | Admitting: Cardiology

## 2023-02-18 ENCOUNTER — Encounter: Payer: Self-pay | Admitting: Cardiology

## 2023-02-18 VITALS — BP 118/76 | HR 73 | Ht 68.0 in | Wt 229.6 lb

## 2023-02-18 DIAGNOSIS — R072 Precordial pain: Secondary | ICD-10-CM | POA: Diagnosis not present

## 2023-02-18 DIAGNOSIS — I10 Essential (primary) hypertension: Secondary | ICD-10-CM | POA: Diagnosis not present

## 2023-02-18 DIAGNOSIS — R0609 Other forms of dyspnea: Secondary | ICD-10-CM | POA: Diagnosis present

## 2023-02-18 LAB — BASIC METABOLIC PANEL
Anion gap: 10 (ref 5–15)
BUN: 48 mg/dL — ABNORMAL HIGH (ref 6–20)
CO2: 24 mmol/L (ref 22–32)
Calcium: 9.6 mg/dL (ref 8.9–10.3)
Chloride: 105 mmol/L (ref 98–111)
Creatinine, Ser: 1.74 mg/dL — ABNORMAL HIGH (ref 0.61–1.24)
GFR, Estimated: 47 mL/min — ABNORMAL LOW (ref >60)
Glucose, Bld: 110 mg/dL — ABNORMAL HIGH (ref 70–99)
Potassium: 4.2 mmol/L (ref 3.5–5.1)
Sodium: 139 mmol/L (ref 135–145)

## 2023-02-18 MED ORDER — IVABRADINE HCL 5 MG PO TABS: 10.0000 mg | ORAL_TABLET | Freq: Once | ORAL | 0 refills | Status: AC

## 2023-02-18 MED ORDER — IVABRADINE HCL 5 MG PO TABS: 10.0000 mg | ORAL_TABLET | Freq: Once | ORAL | 0 refills | Status: DC

## 2023-02-18 MED ORDER — METOPROLOL TARTRATE 100 MG PO TABS: 100.0000 mg | ORAL_TABLET | Freq: Once | ORAL | 0 refills | Status: AC

## 2023-02-18 MED ORDER — METOPROLOL TARTRATE 100 MG PO TABS: 100.0000 mg | ORAL_TABLET | Freq: Once | ORAL | 0 refills | Status: DC

## 2023-02-18 NOTE — Patient Instructions (Signed)
Medication Instructions:   Your physician recommends that you continue on your current medications as directed. Please refer to the Current Medication list given to you today.  *If you need a refill on your cardiac medications before your next appointment, please call your pharmacy*   Lab Work:  Your physician recommends you go to the medical mall for labs.   If you have labs (blood work) drawn today and your tests are completely normal, you will receive your results only by: [place] (if you have MyChart) OR A paper copy in the mail If you have any lab test that is abnormal or we need to change your treatment, we will call you to review the results.   Testing/Procedures:  Your physician has requested that you have an echocardiogram. Echocardiography is a painless test that uses sound waves to create images of your heart. It provides your doctor with information about the size and shape of your heart and how well your heart's chambers and valves are working. This procedure takes approximately one hour. There are no restrictions for this procedure. Please do NOT wear cologne, perfume, aftershave, or lotions (deodorant is allowed). Please arrive 15 minutes prior to your appointment time.    Your cardiac CT will be April 4th 2024 @ 9:30am  [facility] [address] [place], [place] [address] [phone]    If scheduled at [facility] or [facility], please arrive 15 mins early for check-in and test prep.   Please follow these instructions carefully (unless otherwise directed):  Hold all erectile dysfunction medications at least 3 days (72 hrs) prior to test. (Ie viagra, cialis, sildenafil, tadalafil, etc) We will administer nitroglycerin during this exam.   On the Night Before the Test: Be sure to Drink plenty of water. Do not consume any caffeinated/decaffeinated beverages or  chocolate 12 hours prior to your test. Do not take any antihistamines 12 hours prior to your test.  On the Day of the Test: Drink plenty of water until 1 hour prior to the test. Do not eat any food 1 hour prior to test. PLEASE HOLD olmesartan-hydrochlorothiazide (BENICAR HCT) 40-25 MG tablet  the morning of.  Take metoprolol (Lopressor) two hours prior to test. Take Corlanor two hours prior to test You may take your regular medications prior to the test.       After the Test: Drink plenty of water. After receiving IV contrast, you may experience a mild flushed feeling. This is normal. On occasion, you may experience a mild rash up to 24 hours after the test. This is not dangerous. If this occurs, you can take Benadryl 25 mg and increase your fluid intake. If you experience trouble breathing, this can be serious. If it is severe call 911 IMMEDIATELY. If it is mild, please call our office.     Follow-Up: At [facility], you and your health needs are our priority.  As part of our continuing mission to provide you with exceptional heart care, we have created designated Provider Care Teams.  These Care Teams include your primary Cardiologist (physician) and Advanced Practice Providers (APPs -  Physician Assistants and Nurse Practitioners) who all work together to provide you with the care you need, when you need it.  We recommend signing up for the patient portal called "MyChart".  Sign up information is provided on this After Visit Summary.  MyChart is used to connect with patients for Virtual Visits (Telemedicine).  Patients are able to view lab/test results, encounter notes, upcoming appointments, etc.  Non-urgent messages can be sent to your provider as well.   To learn more about what you can do with MyChart, go to [email or web address].    Your next appointment:    After Testing  Provider:   You may see Kate Sable, MD or one of the following Advanced Practice  Providers on your designated Care Team:   Murray Hodgkins, NP Christell Faith, PA-C Cadence Kathlen Mody, PA-C Gerrie Nordmann, NP

## 2023-02-18 NOTE — Progress Notes (Signed)
Cardiology Office Note:    Date:  02/18/2023   ID:  Howard Maldonado, DOB 01-26-1971, MRN [id number]  PCP:  Kathyrn Lass   [place] Prov[facility] Cardiologist:  Kate Sable, MD     Referring MD: Lavonia Drafts, MD   Chief Complaint  Patient presents with   New Patient (Initial Visit)    Dyspnea, No Hx   Howard Maldonado is a 52 y.o. male who is being seen today for the evaluation of dyspnea at the request of Lavonia Drafts, MD.   History of Present Illness:    Howard Maldonado is a 52 y.o. male with a hx of hypertension who presents due to shortness of breath and chest pain.  Symptoms of left-sided chest pain have been ongoing for over a year now, not related with exertion.  Patient denies smoking, denies any history of heart disease.  Presented to the ED, workup was unrevealing, started on Protonix which has helped his symptoms.  Previous cholesterol slightly abnormal, started on Crestor 5 mg daily.  Past Medical History:  Diagnosis Date   Hypertension     Past Surgical History:  Procedure Laterality Date   ANKLE FRACTURE SURGERY Left    HERNIA REPAIR     ROTATOR CUFF REPAIR Left    SHOULDER SURGERY Left     Current Medications: Current Meds  Medication Sig   albuterol (PROVENTIL HFA;VENTOLIN HFA) 108 (90 Base) MCG/ACT inhaler Inhale 1-2 puffs into the lungs every 6 (six) hours as needed for wheezing or shortness of breath.   carvedilol (COREG) 6.25 MG tablet Take 6.25 mg by mouth 2 (two) times daily with a meal.   cyclobenzaprine (FLEXERIL) 10 MG tablet Take 1 tablet (10 mg total) by mouth 2 (two) times daily as needed for muscle spasms.   diphenhydrAMINE (BENADRYL) 25 MG tablet Take 25 mg by mouth every 8 (eight) hours as needed for allergies.    EPINEPHrine 0.3 mg/0.3 mL IJ SOAJ injection Inject 0.3 mg into the muscle as needed.   fluticasone (FLONASE) 50 MCG/ACT nasal spray Place 1 spray into both nostrils daily as needed for allergies or rhinitis.    fluticasone-salmeterol (ADVAIR HFA) 45-21 MCG/ACT inhaler Inhale 2 puffs into the lungs 2 (two) times daily.   olmesartan-hydrochlorothiazide (BENICAR HCT) 40-25 MG tablet Take 1 tablet by mouth daily.   pantoprazole (PROTONIX) 40 MG tablet Take 1 tablet (40 mg total) by mouth daily.   rosuvastatin (CRESTOR) 5 MG tablet Take 5 mg by mouth daily.   [DISCONTINUED] ivabradine (CORLANOR) 5 MG TABS tablet Take 2 tablets (10 mg total) by mouth once for 1 dose. TWO HOURS PRIOR TO CARDIAC CTA   [DISCONTINUED] metoprolol tartrate (LOPRESSOR) 100 MG tablet Take 1 tablet (100 mg total) by mouth once for 1 dose. TWO HOURS PRIOR TO CARDIAC CTA     Allergies:   Midazolam, Nsaids, and Nsaids   Social History   Socioeconomic History   Marital status: Legally Separated    Spouse name: Not on file   Number of children: Not on file   Years of education: Not on file   Highest education level: Not on file  Occupational History   Not on file  Tobacco Use   Smoking status: Never   Smokeless tobacco: Never  Substance and Sexual Activity   Alcohol use: Yes    Comment: occ   Drug use: Never   Sexual activity: Not on file  Other Topics Concern   Not on file  Social History Narrative   **  Merged History Encounter **       Social Determinants of Health   Financial Resource Strain: Not on file  Food Insecurity: Not on file  Transportation Needs: Not on file  Physical Activity: Not on file  Stress: Not on file  Social Connections: Not on file     Family History: The patient's family history is not on file.  ROS:   Please see the history of present illness.     All other systems reviewed and are negative.  EKGs/Labs/Other Studies Reviewed:    The following studies were reviewed today:   EKG:  EKG is  ordered today.  The ekg ordered today demonstrates normal sinus rhythm, normal ECG.  Recent Labs: 01/28/2023: BUN 26; Creatinine, Ser 1.53; Hemoglobin 13.7; Platelets 206; Potassium 3.9; Sodium  133  Recent Lipid Panel No results found for: "CHOL", "TRIG", "HDL", "CHOLHDL", "VLDL", "LDLCALC", "LDLDIRECT"   Risk Assessment/Calculations:             Physical Exam:    VS:  BP 118/76 (BP Location: Left Arm)   Pulse 73   Ht 5\' 8"  (1.727 m)   Wt 229 lb 9.6 oz (104.1 kg)   SpO2 93%   BMI 34.91 kg/m     Wt Readings from Last 3 Encounters:  02/18/23 229 lb 9.6 oz (104.1 kg)  01/28/23 216 lb 0.8 oz (98 kg)  06/19/22 216 lb 0.8 oz (98 kg)     GEN:  Well nourished, well developed in no acute distress HEENT: Normal NECK: No JVD; No carotid bruits CARDIAC: RRR, no murmurs, rubs, gallops RESPIRATORY:  Clear to auscultation without rales, wheezing or rhonchi  ABDOMEN: Soft, non-tender, non-distended MUSCULOSKELETAL:  No edema; No deformity  SKIN: Warm and dry NEUROLOGIC:  Alert and oriented x 3 PSYCHIATRIC:  Normal affect   ASSESSMENT:    1. Dyspnea on exertion   2. Precordial pain   3. Primary hypertension    PLAN:    In order of problems listed above:  Dyspnea on exertion, chest pain.  This could be an anginal equivalent.  Get echo, get coronary CTA.  Continue Protonix. Hypertension, BP controlled.  Continue Coreg, olmesartan, HCTZ.  Follow-up after echo and coronary CTA.     Medication Adjustments/Labs and Tests Ordered: Current medicines are reviewed at length with the patient today.  Concerns regarding medicines are outlined above.  Orders Placed This Encounter  Procedures   CT CORONARY MORPH W/CTA COR W/SCORE W/CA W/CM &/OR WO/CM   Basic Metabolic Panel (BMET)   EKG 12-Lead   ECHOCARDIOGRAM COMPLETE   Meds ordered this encounter  Medications   DISCONTD: metoprolol tartrate (LOPRESSOR) 100 MG tablet    Sig: Take 1 tablet (100 mg total) by mouth once for 1 dose. TWO HOURS PRIOR TO CARDIAC CTA    Dispense:  1 tablet    Refill:  0   DISCONTD: ivabradine (CORLANOR) 5 MG TABS tablet    Sig: Take 2 tablets (10 mg total) by mouth once for 1 dose. TWO HOURS  PRIOR TO CARDIAC CTA    Dispense:  2 tablet    Refill:  0   ivabradine (CORLANOR) 5 MG TABS tablet    Sig: Take 2 tablets (10 mg total) by mouth once for 1 dose. TWO HOURS PRIOR TO CARDIAC CTA    Dispense:  2 tablet    Refill:  0   metoprolol tartrate (LOPRESSOR) 100 MG tablet    Sig: Take 1 tablet (100 mg total) by mouth once  for 1 dose. TWO HOURS PRIOR TO CARDIAC CTA    Dispense:  1 tablet    Refill:  0    Patient Instructions  Medication Instructions:   Your physician recommends that you continue on your current medications as directed. Please refer to the Current Medication list given to you today.  *If you need a refill on your cardiac medications before your next appointment, please call your pharmacy*   Lab Work:  Your physician recommends you go to the medical mall for labs.   If you have labs (blood work) drawn today and your tests are completely normal, you will receive your results only by: [place] (if you have MyChart) OR A paper copy in the mail If you have any lab test that is abnormal or we need to change your treatment, we will call you to review the results.   Testing/Procedures:  Your physician has requested that you have an echocardiogram. Echocardiography is a painless test that uses sound waves to create images of your heart. It provides your doctor with information about the size and shape of your heart and how well your heart's chambers and valves are working. This procedure takes approximately one hour. There are no restrictions for this procedure. Please do NOT wear cologne, perfume, aftershave, or lotions (deodorant is allowed). Please arrive 15 minutes prior to your appointment time.    Your cardiac CT will be April 4th 2024 @ 9:30am  [facility] [address] [place], [place] [address] [phone]    If scheduled at [facility] or [facility], please arrive 15 mins early for check-in and test prep.   Please follow these instructions carefully (unless otherwise directed):  Hold all erectile dysfunction medications at least 3 days (72 hrs) prior to test. (Ie viagra, cialis, sildenafil, tadalafil, etc) We will administer nitroglycerin during this exam.   On the Night Before the Test: Be sure to Drink plenty of water. Do not consume any caffeinated/decaffeinated beverages or chocolate 12 hours prior to your test. Do not take any antihistamines 12 hours prior to your test.  On the Day of the Test: Drink plenty of water until 1 hour prior to the test. Do not eat any food 1 hour prior to test. PLEASE HOLD olmesartan-hydrochlorothiazide (BENICAR HCT) 40-25 MG tablet  the morning of.  Take metoprolol (Lopressor) two hours prior to test. Take Corlanor two hours prior to test You may take your regular medications prior to the test.       After the Test: Drink plenty of water. After receiving IV contrast, you may experience a mild flushed feeling. This is normal. On occasion, you may experience a mild rash up to 24 hours after the test. This is not dangerous. If this occurs, you can take Benadryl 25 mg and increase your fluid intake. If you experience trouble breathing, this can be serious. If it is severe call 911 IMMEDIATELY. If it is mild, please call our office.     Follow-Up: At [facility], you and your health needs are our priority.  As part of our continuing mission to provide you with exceptional heart care, we have created designated Provider Care Teams.  These Care Teams include your primary Cardiologist (physician) and Advanced Practice Providers (APPs -  Physician Assistants and Nurse Practitioners) who all work together to provide you with the care you need, when you need it.  We recommend signing up for the  patient portal called "MyChart".  Sign up information is provided on this After Visit Summary.   MyChart is used to connect with patients for Virtual Visits (Telemedicine).  Patients are able to view lab/test results, encounter notes, upcoming appointments, etc.  Non-urgent messages can be sent to your provider as well.   To learn more about what you can do with MyChart, go to [email or web address].    Your next appointment:    After Testing  Provider:   You may see Kate Sable, MD or one of the following Advanced Practice Providers on your designated Care Team:   Murray Hodgkins, NP Christell Faith, PA-C Cadence Kathlen Mody, PA-C Gerrie Nordmann, NP   Signed, Kate Sable, MD  02/18/2023 9:29 AM    [place]

## 2023-02-21 ENCOUNTER — Other Ambulatory Visit: Payer: Self-pay

## 2023-02-21 ENCOUNTER — Telehealth: Payer: Self-pay

## 2023-02-21 DIAGNOSIS — I2510 Atherosclerotic heart disease of native coronary artery without angina pectoris: Secondary | ICD-10-CM

## 2023-02-21 DIAGNOSIS — R079 Chest pain, unspecified: Secondary | ICD-10-CM

## 2023-02-21 MED ORDER — OLMESARTAN MEDOXOMIL 40 MG PO TABS: 40.0000 mg | ORAL_TABLET | Freq: Every day | ORAL | 3 refills | Status: AC

## 2023-02-21 NOTE — Telephone Encounter (Signed)
Orders received verbally by Dr. Garen Lah:  - STOP Benicar.  - START Olmesartan 40mg  - Check a BMP the day of his myoview (03/07/2023)    Janan Ridge, [place] 02/21/2023 11:17 AM EDT Back to Top    Called Patient. Reviewed results. Patient became very tearful due to his kidney function being abnormal again. He wanted me to speak to his wife but did not put her on the phone. Patient stated that he needed to get off the phone and gather himself together. Before getting off the phone he told me to schedule him for whatever is needed and he will do it. Reassured him that we have the best interest in his care. Made him aware that I would be in touch when I have everything scheduled and instructions for him.   Janan Ridge, [place] 02/19/2023 11:07 AM EDT     Called patient. No answer. No VM.   Kate Sable, MD 02/19/2023 10:51 AM EDT     Creatinine is elevated indicating chronic kidney disease.  Cancel coronary CTA to avoid contrast-induced worsening renal function.  Please order Lexiscan Myoview.  Refer patient to nephrology.

## 2023-03-07 ENCOUNTER — Encounter: Admission: RE | Admit: 2023-03-07 | Payer: 59 | Source: Ambulatory Visit

## 2023-03-07 ENCOUNTER — Ambulatory Visit: Payer: 59

## 2023-03-28 ENCOUNTER — Ambulatory Visit: Payer: 59

## 2023-03-29 ENCOUNTER — Ambulatory Visit: Payer: 59 | Admitting: Cardiology

## 2023-04-24 ENCOUNTER — Ambulatory Visit: Payer: 59 | Attending: Cardiology

## 2023-04-26 ENCOUNTER — Ambulatory Visit: Payer: 59 | Attending: Cardiology | Admitting: Physician Assistant

## 2023-04-26 NOTE — Progress Notes (Deleted)
Cardiology Office Note    Date:  04/26/2023   ID:  Howard Maldonado, DOB 09/01/1971, MRN [id number]  PCP:  Pcp, No  Cardiologist:  Debbe Odea, MD  Electrophysiologist:  None   Chief Complaint: ***  History of Present Illness:   Howard Maldonado is a 52 y.o. male with history of ***  He was evaluated as a new patient on 02/18/2023 for ED follow-up of greater than 1 year history of left-sided chest discomfort and dyspnea.  He had been seen in the ED in 01/2023 for the symptoms with negative high-sensitivity troponin.  Coronary CTA was initially recommended, however labs showed renal dysfunction with recommendation to transition ischemic testing to [facility] MPI.  Patient did not show for this study.  Echo was scheduled for 04/24/2023, patient no showed.  He has been evaluated in the ED multiple times with negative cardiac workup.  ***   Labs independently reviewed: 03/2023 - BUN 22, serum creatinine 1.48, potassium 4.1, albumin 4.1, AST/ALT normal, A1c 5.9, Hgb 12.9, PLT 204 12/2022 - TSH normal 01/2022 - TC 202, TG 140, HDL 60, LDL 114  Past Medical History:  Diagnosis Date   Hypertension     Past Surgical History:  Procedure Laterality Date   ANKLE FRACTURE SURGERY Left    HERNIA REPAIR     ROTATOR CUFF REPAIR Left    SHOULDER SURGERY Left     Current Medications: No outpatient medications have been marked as taking for the 04/26/23 encounter (Appointment) with Sondra Barges, PA-C.    Allergies:   Midazolam, Nsaids, and Nsaids   Social History   Socioeconomic History   Marital status: Legally Separated    Spouse name: Not on file   Number of children: Not on file   Years of education: Not on file   Highest education level: Not on file  Occupational History   Not on file  Tobacco Use   Smoking status: Never   Smokeless tobacco: Never  Substance and Sexual Activity   Alcohol use: Yes    Comment: occ   Drug use: Never   Sexual activity: Not on file  Other  Topics Concern   Not on file  Social History Narrative   ** Merged History Encounter **       Social Determinants of Health   Financial Resource Strain: Not on file  Food Insecurity: Not on file  Transportation Needs: Not on file  Physical Activity: Not on file  Stress: Not on file  Social Connections: Not on file     Family History:  The patient's family history is not on file.  ROS:   12-point review of systems is negative unless otherwise noted in the HPI.   EKGs/Labs/Other Studies Reviewed:    Studies reviewed were summarized above. The additional studies were reviewed today: None available for review.   EKG:  EKG is ordered today.  The EKG ordered today demonstrates ***  Recent Labs: 01/28/2023: Hemoglobin 13.7; Platelets 206 02/18/2023: BUN 48; Creatinine, Ser 1.74; Potassium 4.2; Sodium 139  Recent Lipid Panel No results found for: "CHOL", "TRIG", "HDL", "CHOLHDL", "VLDL", "LDLCALC", "LDLDIRECT"  PHYSICAL EXAM:    VS:  There were no vitals taken for this visit.  BMI: There is no height or weight on file to calculate BMI.  Physical Exam  Wt Readings from Last 3 Encounters:  02/18/23 229 lb 9.6 oz (104.1 kg)  01/28/23 216 lb 0.8 oz (98 kg)  06/19/22 216 lb 0.8 oz (98 kg)  ASSESSMENT & PLAN:   ***   {Are you ordering a CV Procedure (e.g. stress test, cath, DCCV, TEE, etc)?   Press F2        :[id number]}     Disposition: F/u with Dr. Azucena Cecil or an APP in ***.   Medication Adjustments/Labs and Tests Ordered: Current medicines are reviewed at length with the patient today.  Concerns regarding medicines are outlined above. Medication changes, Labs and Tests ordered today are summarized above and listed in the Patient Instructions accessible in Encounters.   Signed, Eula Listen, PA-C 04/26/2023 7:02 AM     [facility] [place] [address] [place], [place] [address] [phone]

## 2023-05-15 ENCOUNTER — Ambulatory Visit (INDEPENDENT_AMBULATORY_CARE_PROVIDER_SITE_OTHER): Payer: 59 | Admitting: Primary Care

## 2023-05-15 ENCOUNTER — Encounter: Payer: Self-pay | Admitting: Primary Care

## 2023-05-15 VITALS — BP 133/88 | HR 94 | Ht 68.0 in | Wt 236.6 lb

## 2023-05-15 DIAGNOSIS — R0683 Snoring: Secondary | ICD-10-CM

## 2023-05-15 NOTE — Assessment & Plan Note (Addendum)
-   Patient has symptoms of loud snoring and witnessed apnea. Associated daytime sleepiness.  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 He was not started on CPAP. Epworth 15/24.15 BMI 35.  Concern patient could have underlying obstructive sleep apnea, needs home sleep study to evaluate.  We reviewed risks of untreated sleep apnea including cardiac arrhythmias, pulmonary hypertension, diabetes and stroke.  We also discussed treatment options including weight loss, oral appliance, CPAP therapy or referral to ENT for possible.  Position.  Advised against driving if experiencing excessive daytime sleepiness or fatigue.  Follow-up 1 to 2 weeks after sleep study review results and treatment options if needed.

## 2023-05-15 NOTE — Progress Notes (Signed)
@Patient  ID: Howard Maldonado, male    DOB: 1970-12-21, 52 y.o.   MRN: [id number]  Chief Complaint  Patient presents with   Consult    Pt sleep consult states that he had past sleep study >76yrs, wife reports snoring, witnessed apneas and gasping.     Referring provider: Randa Lynn, MD  HPI: 52 year old male, never smoked. PMH significant for adrenal mass, hypertension, CKD, obesity.  05/15/2023 Patient presents today for sleep consult. Accompanied by his wife. He has symptoms of snoring, witnessed apnea. He has woken him self up gasping for air/choking, this does not happen frequently and he thinks could be related to reflux. Some associated daytime sleepiness. Per his wife, he falls asleep easily when sitting/watching tv. He does not sleep through the night. Typical bedtime is 11pm. It does not take him long to fall asleep. He wakes up on average 3-4 times a night. He starts his day between 4-6am. No concern for narcolepsy or cataplexy. He had a sleep study in 2017 that showed mild sleep apnea, AHI 11.9/hour (REM AHI 29.6/hr). He was not started on CPAP. Epworth 15.   Sleep questionnaire Symptoms- Snoring, witnessed apnea, waking up gasping up air  Previous sleep studies- 2017>> AHI 11.9/hour, never started on CPAP Typical bedtime- 11pm-1am Time to fall asleep- immediate  Nocturnal awakenings- 3-4 times  Start of day- 4-6am Weight changes- 20 lbs Do you operate heavy machinery-No  Do you wear CPAP- no Do you wear oxygen- no Epworth- 15  Allergies  Allergen Reactions   Midazolam Anxiety   Nsaids Shortness Of Breath   Nsaids Hives   Prochlorperazine Anxiety    Immunization History  Administered Date(s) Administered   Covid-19, Mrna,Vaccine(Spikevax)82yrs and older 02/04/2023   Moderna Sars-Covid-2 Vaccination 02/10/2020, 03/09/2020, 10/20/2020   Pfizer Covid-19 Vaccine Bivalent Booster 45yrs & up 02/20/2022    Past Medical History:  Diagnosis Date   Hypertension      Tobacco History: Social History   Tobacco Use  Smoking Status Never  Smokeless Tobacco Never   Counseling given: Not Answered   Outpatient Medications Prior to Visit  Medication Sig Dispense Refill   albuterol (PROVENTIL HFA;VENTOLIN HFA) 108 (90 Base) MCG/ACT inhaler Inhale 1-2 puffs into the lungs every 6 (six) hours as needed for wheezing or shortness of breath. 1 Inhaler 0   carvedilol (COREG) 6.25 MG tablet Take 6.25 mg by mouth 2 (two) times daily with a meal.     cyclobenzaprine (FLEXERIL) 10 MG tablet Take 1 tablet (10 mg total) by mouth 2 (two) times daily as needed for muscle spasms. 20 tablet 0   diphenhydrAMINE (BENADRYL) 25 MG tablet Take 25 mg by mouth every 8 (eight) hours as needed for allergies.      EPINEPHrine 0.3 mg/0.3 mL IJ SOAJ injection Inject 0.3 mg into the muscle as needed.     esomeprazole (NEXIUM) 40 MG capsule Take 40 mg by mouth daily.     fluticasone (FLONASE) 50 MCG/ACT nasal spray Place 1 spray into both nostrils daily as needed for allergies or rhinitis.     fluticasone-salmeterol (ADVAIR HFA) 45-21 MCG/ACT inhaler Inhale 2 puffs into the lungs 2 (two) times daily. 1 Inhaler 12   olmesartan (BENICAR) 40 MG tablet Take 1 tablet (40 mg total) by mouth daily. 90 tablet 3   rosuvastatin (CRESTOR) 5 MG tablet Take 5 mg by mouth daily.     metoprolol tartrate (LOPRESSOR) 100 MG tablet Take 1 tablet (100 mg total) by mouth once  for 1 dose. TWO HOURS PRIOR TO CARDIAC CTA 1 tablet 0   pantoprazole (PROTONIX) 40 MG tablet Take 1 tablet (40 mg total) by mouth daily. 30 tablet 0   No facility-administered medications prior to visit.   Review of Systems  Review of Systems  Constitutional:  Positive for fatigue.  Respiratory:  Negative for chest tightness, shortness of breath and wheezing.   Psychiatric/Behavioral:  Positive for sleep disturbance.    Physical Exam  BP 133/88   Pulse 94   Ht 5\' 8"  (1.727 m)   Wt 236 lb 9.6 oz (107.3 kg)   SpO2 96%    BMI 35.97 kg/m  Physical Exam Constitutional:      Appearance: Normal appearance.  HENT:     Head: Normocephalic and atraumatic.     Mouth/Throat:     Mouth: Mucous membranes are moist.     Pharynx: Oropharynx is clear.     Comments: Mallampati class II Cardiovascular:     Rate and Rhythm: Normal rate and regular rhythm.  Pulmonary:     Effort: Pulmonary effort is normal.     Breath sounds: Normal breath sounds.  Musculoskeletal:        General: Normal range of motion.  Skin:    General: Skin is warm and dry.  Neurological:     General: No focal deficit present.     Mental Status: He is alert and oriented to person, place, and time. Mental status is at baseline.  Psychiatric:        Mood and Affect: Mood normal.        Behavior: Behavior normal.        Thought Content: Thought content normal.        Judgment: Judgment normal.      Lab Results:  CBC    Component Value Date/Time   WBC 7.7 01/28/2023 0906   RBC 4.79 01/28/2023 0906   HGB 13.7 01/28/2023 0906   HCT 41.6 01/28/2023 0906   PLT 206 01/28/2023 0906   MCV 86.8 01/28/2023 0906   MCH 28.6 01/28/2023 0906   MCHC 32.9 01/28/2023 0906   RDW 12.9 01/28/2023 0906    BMET    Component Value Date/Time   NA 139 02/18/2023 0937   K 4.2 02/18/2023 0937   CL 105 02/18/2023 0937   CO2 24 02/18/2023 0937   GLUCOSE 110 (H) 02/18/2023 0937   BUN 48 (H) 02/18/2023 0937   CREATININE 1.74 (H) 02/18/2023 0937   CALCIUM 9.6 02/18/2023 0937   GFRNONAA 47 (L) 02/18/2023 0937   GFRAA 55 (L) 07/13/2020 1752    BNP No results found for: "BNP"  ProBNP No results found for: "PROBNP"  Imaging: No results found.   Assessment & Plan:   Loud snoring - Patient has symptoms of loud snoring and witnessed apnea. Associated daytime sleepiness.  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 He was not started on CPAP. Epworth 15/24.15 BMI 35.  Concern patient could have underlying obstructive sleep apnea, needs  home sleep study to evaluate.  We reviewed risks of untreated sleep apnea including cardiac arrhythmias, pulmonary hypertension, diabetes and stroke.  We also discussed treatment options including weight loss, oral appliance, CPAP therapy or referral to ENT for possible.  Position.  Advised against driving if experiencing excessive daytime sleepiness or fatigue.  Follow-up 1 to 2 weeks after sleep study review results and treatment options if needed.   Glenford Bayley, NP 05/15/2023

## 2023-05-15 NOTE — Patient Instructions (Addendum)
Recommendations: Take pantoprazole daily if having reflux symptoms at night more than 2 times a week Follow GERD diet (see below patient education) Focus on side sleeping position or elevate head with wedge pillow Don't drive if tired Avoid alcohol/sedative prior to bedtime   Orders: Home sleep study  Follow-up: Call 1-2 weeks after completing sleep study to schedule virtual visit to go over results/treatment options   Sleep Apnea Sleep apnea is a condition in which breathing pauses or becomes shallow during sleep. People with sleep apnea usually snore loudly. They may have times when they gasp and stop breathing for 10 seconds or more during sleep. This may happen many times during the night. Sleep apnea disrupts your sleep and keeps your body from getting the rest that it needs. This condition can increase your risk of certain health problems, including: Heart attack. Stroke. Obesity. Type 2 diabetes. Heart failure. Irregular heartbeat. High blood pressure. The goal of treatment is to help you breathe normally again. What are the causes?  The most common cause of sleep apnea is a collapsed or blocked airway. There are three kinds of sleep apnea: Obstructive sleep apnea. This kind is caused by a blocked or collapsed airway. Central sleep apnea. This kind happens when the part of the brain that controls breathing does not send the correct signals to the muscles that control breathing. Mixed sleep apnea. This is a combination of obstructive and central sleep apnea. What increases the risk? You are more likely to develop this condition if you: Are overweight. Smoke. Have a smaller than normal airway. Are older. Are male. Drink alcohol. Take sedatives or tranquilizers. Have a family history of sleep apnea. Have a tongue or tonsils that are larger than normal. What are the signs or symptoms? Symptoms of this condition include: Trouble staying asleep. Loud snoring. Morning  headaches. Waking up gasping. Dry mouth or sore throat in the morning. Daytime sleepiness and tiredness. If you have daytime fatigue because of sleep apnea, you may be more likely to have: Trouble concentrating. Forgetfulness. Irritability or mood swings. Personality changes. Feelings of depression. Sexual dysfunction. This may include loss of interest if you are male, or erectile dysfunction if you are male. How is this diagnosed? This condition may be diagnosed with: A medical history. A physical exam. A series of tests that are done while you are sleeping (sleep study). These tests are usually done in a sleep lab, but they may also be done at home. How is this treated? Treatment for this condition aims to restore normal breathing and to ease symptoms during sleep. It may involve managing health issues that can affect breathing, such as high blood pressure or obesity. Treatment may include: Sleeping on your side. Using a decongestant if you have nasal congestion. Avoiding the use of depressants, including alcohol, sedatives, and narcotics. Losing weight if you are overweight. Making changes to your diet. Quitting smoking. Using a device to open your airway while you sleep, such as: An oral appliance. This is a custom-made mouthpiece that shifts your lower jaw forward. A continuous positive airway pressure (CPAP) device. This device blows air through a mask when you breathe out (exhale). A nasal expiratory positive airway pressure (EPAP) device. This device has valves that you put into each nostril. A bi-level positive airway pressure (BIPAP) device. This device blows air through a mask when you breathe in (inhale) and breathe out (exhale). Having surgery if other treatments do not work. During surgery, excess tissue is removed to create  a wider airway. Follow these instructions at home: Lifestyle Make any lifestyle changes that your health care provider recommends. Eat a healthy,  well-balanced diet. Take steps to lose weight if you are overweight. Avoid using depressants, including alcohol, sedatives, and narcotics. Do not use any products that contain nicotine or tobacco. These products include cigarettes, chewing tobacco, and vaping devices, such as e-cigarettes. If you need help quitting, ask your health care provider. General instructions Take over-the-counter and prescription medicines only as told by your health care provider. If you were given a device to open your airway while you sleep, use it only as told by your health care provider. If you are having surgery, make sure to tell your health care provider you have sleep apnea. You may need to bring your device with you. Keep all follow-up visits. This is important. Contact a health care provider if: The device that you received to open your airway during sleep is uncomfortable or does not seem to be working. Your symptoms do not improve. Your symptoms get worse. Get help right away if: You develop: Chest pain. Shortness of breath. Discomfort in your back, arms, or stomach. You have: Trouble speaking. Weakness on one side of your body. Drooping in your face. These symptoms may represent a serious problem that is an emergency. Do not wait to see if the symptoms will go away. Get medical help right away. Call your local emergency services (911 in the [place] Do not drive yourself to the hospital. Summary Sleep apnea is a condition in which breathing pauses or becomes shallow during sleep. The most common cause is a collapsed or blocked airway. The goal of treatment is to restore normal breathing and to ease symptoms during sleep. This information is not intended to replace advice given to you by your health care provider. Make sure you discuss any questions you have with your health care provider. Document Revised: 06/28/2021 Document Reviewed: 10/28/2020 Elsevier Patient Education  2024 [employer]   Food Choices for Gastroesophageal Reflux Disease, Adult When you have gastroesophageal reflux disease (GERD), the foods you eat and your eating habits are very important. Choosing the right foods can help ease your discomfort. Think about working with a food expert (dietitian) to help you make good choices. What are tips for following this plan? Reading food labels Look for foods that are low in saturated fat. Foods that may help with your symptoms include: Foods that have less than 5% of daily value (DV) of fat. Foods that have 0 grams of trans fat. Cooking Do not fry your food. Cook your food by baking, steaming, grilling, or broiling. These are all methods that do not need a lot of fat for cooking. To add flavor, try to use herbs that are low in spice and acidity. Meal planning  Choose healthy foods that are low in fat, such as: Fruits and vegetables. Whole grains. Low-fat dairy products. Lean meats, fish, and poultry. Eat small meals often instead of eating 3 large meals each day. Eat your meals slowly in a place where you are relaxed. Avoid bending over or lying down until 2-3 hours after eating. Limit high-fat foods such as fatty meats or fried foods. Limit your intake of fatty foods, such as oils, butter, and shortening. Avoid the following as told by your doctor: Foods that cause symptoms. These may be different for different people. Keep a food diary to keep track of foods that cause symptoms. Alcohol. Drinking a lot of liquid with  meals. Eating meals during the 2-3 hours before bed. Lifestyle Stay at a healthy weight. Ask your doctor what weight is healthy for you. If you need to lose weight, work with your doctor to do so safely. Exercise for at least 30 minutes on 5 or more days each week, or as told by your doctor. Wear loose-fitting clothes. Do not smoke or use any products that contain nicotine or tobacco. If you need help quitting, ask your doctor. Sleep with  the head of your bed higher than your feet. Use a wedge under the mattress or blocks under the bed frame to raise the head of the bed. Chew sugar-free gum after meals. What foods should eat?  Eat a healthy, well-balanced diet of fruits, vegetables, whole grains, low-fat dairy products, lean meats, fish, and poultry. Each person is different. Foods that may cause symptoms in one person may not cause any symptoms in another person. Work with your doctor to find foods that are safe for you. The items listed above may not be a complete list of what you can eat and drink. Contact a food expert for more options. What foods should I avoid? Limiting some of these foods may help in managing the symptoms of GERD. Everyone is different. Talk with a food expert or your doctor to help you find the exact foods to avoid, if any. Fruits Any fruits prepared with added fat. Any fruits that cause symptoms. For some people, this may include citrus fruits, such as oranges, grapefruit, pineapple, and lemons. Vegetables Deep-fried vegetables. [place] fries. Any vegetables prepared with added fat. Any vegetables that cause symptoms. For some people, this may include tomatoes and tomato products, chili peppers, onions and garlic, and horseradish. Grains Pastries or quick breads with added fat. Meats and other proteins High-fat meats, such as fatty beef or pork, hot dogs, ribs, ham, sausage, salami, and bacon. Fried meat or protein, including fried fish and fried chicken. Nuts and nut butters, in large amounts. Dairy Whole milk and chocolate milk. Sour cream. Cream. Ice cream. Cream cheese. Milkshakes. Fats and oils Butter. Margarine. Shortening. Ghee. Beverages Coffee and tea, with or without caffeine. Carbonated beverages. Sodas. Energy drinks. Fruit juice made with acidic fruits, such as orange or grapefruit. Tomato juice. Alcoholic drinks. Sweets and desserts Chocolate and cocoa. Donuts. Seasonings and  condiments Pepper. Peppermint and spearmint. Added salt. Any condiments, herbs, or seasonings that cause symptoms. For some people, this may include curry, hot sauce, or vinegar-based salad dressings. The items listed above may not be a complete list of what you should not eat and drink. Contact a food expert for more options. Questions to ask your doctor Diet and lifestyle changes are often the first steps that are taken to manage symptoms of GERD. If diet and lifestyle changes do not help, talk with your doctor about taking medicines. Where to find more information [employer]: aboutgerd.org Summary When you have GERD, food and lifestyle choices are very important in easing your symptoms. Eat small meals often instead of 3 large meals a day. Eat your meals slowly and in a place where you are relaxed. Avoid bending over or lying down until 2-3 hours after eating. Limit high-fat foods such as fatty meats or fried foods. This information is not intended to replace advice given to you by your health care provider. Make sure you discuss any questions you have with your health care provider. Document Revised: 05/30/2020 Document Reviewed: 05/30/2020 Elsevier Patient Education  2024  [employer].

## 2023-05-15 NOTE — Progress Notes (Signed)
Reviewed and agree with assessment/plan.   Coralyn Helling, MD [facility] Pulmonary/Critical Care 05/15/2023, 2:04 PM Pager:  [phone]

## 2023-12-10 ENCOUNTER — Emergency Department
Admission: EM | Admit: 2023-12-10 | Discharge: 2023-12-10 | Disposition: A | Discharge status: Home / Self Care | Payer: 59 | Attending: Student in an Organized Health Care Education/Training Program | Admitting: Student in an Organized Health Care Education/Training Program

## 2023-12-10 ENCOUNTER — Other Ambulatory Visit: Payer: Self-pay

## 2023-12-10 ENCOUNTER — Emergency Department: Payer: 59

## 2023-12-10 DIAGNOSIS — W109XXA Fall (on) (from) unspecified stairs and steps, initial encounter: Secondary | ICD-10-CM | POA: Diagnosis not present

## 2023-12-10 DIAGNOSIS — S99912A Unspecified injury of left ankle, initial encounter: Secondary | ICD-10-CM | POA: Diagnosis present

## 2023-12-10 DIAGNOSIS — S93402A Sprain of unspecified ligament of left ankle, initial encounter: Secondary | ICD-10-CM | POA: Diagnosis not present

## 2023-12-10 MED ORDER — PREDNISONE 20 MG PO TABS
60.0000 mg | ORAL_TABLET | Freq: Once | ORAL | Status: AC
  Administered 2023-12-10: 60 mg via ORAL
  Filled 2023-12-10: qty 3

## 2023-12-10 MED ORDER — PREDNISONE 10 MG PO TABS: 10.0000 mg | ORAL_TABLET | ORAL | 0 refills | Status: AC

## 2023-12-10 NOTE — ED Triage Notes (Signed)
 Pt to ED For fall down 2-3 steps today, reports just missed a step. States left ankle pain, feels like he twisted it. Ambulatory, NAD noted.  Denies hitting head or LOC

## 2023-12-10 NOTE — ED Provider Notes (Signed)
 [facility] Provider Note  Patient Contact: 4:54 PM (approximate)   History   Fall and Ankle Pain   HPI  Howard Maldonado is a 53 y.o. male who presents to the emergency department complaining of left ankle pain.  Patient states that he missed a step and fell 2-3 steps.  He landed awkwardly on his ankle.  He has had previous surgery to the ankle and is concerned as he is having pain with weightbearing.  Patient states that it feels like the pain is primarily with stretch/pushing off with the weightbearing.  This pain is along the anterior medial portion of the ankle.  No other injury or complaint.     Physical Exam   Triage Vital Signs: ED Triage Vitals  Encounter Vitals Group     BP 12/10/23 1405 (!) 146/95     Systolic BP Percentile --      Diastolic BP Percentile --      Pulse Rate 12/10/23 1405 87     Resp 12/10/23 1405 18     Temp 12/10/23 1405 98 F (36.7 C)     Temp src --      SpO2 12/10/23 1405 95 %     Weight 12/10/23 1404 220 lb (99.8 kg)     Height 12/10/23 1404 5' 8 (1.727 m)     Head Circumference --      Peak Flow --      Pain Score 12/10/23 1404 10     Pain Loc --      Pain Education --      Exclude from Growth Chart --     Most recent vital signs: Vitals:   12/10/23 1405  BP: (!) 146/95  Pulse: 87  Resp: 18  Temp: 98 F (36.7 C)  SpO2: 95%     General: Alert and in no acute distress.  Cardiovascular:  Good peripheral perfusion Respiratory: Normal respiratory effort without tachypnea or retractions. Lungs CTAB.  Musculoskeletal: Full range of motion to all extremities.  Patient with no gross edema or ecchymosis of the ankle.  Tender along the anterior medial aspect of the ankle without palpable abnormality.  Pulse, sensation, cap refill intact.  No palpable abnormalities. Neurologic:  No gross focal neurologic deficits are appreciated.  Skin:   No rash noted Other:   ED Results / Procedures / Treatments    Labs (all labs ordered are listed, but only abnormal results are displayed) Labs Reviewed - No data to display   EKG     RADIOLOGY  I personally viewed, evaluated, and interpreted these images as part of my medical decision making, as well as reviewing the written report by the radiologist.  ED Provider Interpretation: Patient with small lesion along the lateral talar dome with possible cortical irregularity.  There is a 0.4 cm fragment posterior to the tibiotalar joint.  Unsure whether this was from previous injury/surgery versus acute.  Patient is tender at this region however.  DG Ankle Complete Left Result Date: 12/10/2023 CLINICAL DATA:  Twisting ankle injury on stairs, medial ankle pain EXAM: LEFT ANKLE COMPLETE - 3+ VIEW COMPARISON:  None Available. 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 0.4 cm osteochondral fragment posteriorly in the tibiotalar joint. Mild spurring along the distal tibiofibular articulation and mild spurring of the distal fibular tip. No widening of the mortise. Mild spurring of the talar head and along the margin of the talar dome.  IMPRESSION: 1. 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 2. 0.4 cm osteochondral fragment posteriorly in the tibiotalar joint. 3. Mild spurring of the distal tibiofibular articulation and distal fibular tip. 4. Mild spurring of the talar head and along the margin of the talar dome. Electronically Signed   By: Ryan Salvage M.D.   On: 12/10/2023 14:53    PROCEDURES:  Critical Care performed: No  Procedures   MEDICATIONS ORDERED IN ED: Medications - No data to display   IMPRESSION / MDM / ASSESSMENT AND PLAN / ED COURSE  I reviewed the triage vital signs and the nursing notes.                                 Differential diagnosis includes, but is  not limited to, ankle sprain, ankle fracture, ankle dislocation, ligamentous tear  Patient's presentation is most consistent with acute presentation with potential threat to life or bodily function.   Patient's diagnosis is consistent with avulsion injury of the talus.  Patient presents to the emergency department after missing 2 steps yesterday.  Patient has had previous surgery on the ankle.  There is 2 small areas of small osseous abnormalities on x-ray.  The first is along the lateral talar dome with cortical irregularity and the other is also near the talus with a small osteochondral fragment.  Unsure whether this is truly acute versus previous injury.  As such we will place the patient in a boot, patient is unable to take NSAIDs so we will provide steroids.  Follow-up with podiatry as needed..  Patient is given ED precautions to return to the ED for any worsening or new symptoms.     FINAL CLINICAL IMPRESSION(S) / ED DIAGNOSES   Final diagnoses:  Sprain of left ankle, unspecified ligament, initial encounter     Rx / DC Orders   ED Discharge Orders          Ordered    predniSONE  (DELTASONE ) 10 MG tablet  As directed       Note to Pharmacy: Take on a pattern of 6, 6, 5, 5, 4, 4, 3, 3, 2, 2, 1, 1   12/10/23 1703             Note:  This document was prepared using Dragon voice recognition software and may include unintentional dictation errors.   Ana Dorn JONETTA DEVONNA 12/10/23 1703    Lang Dover, MD 12/10/23 ALVIE

## 2023-12-18 ENCOUNTER — Ambulatory Visit: Payer: 59 | Attending: Cardiology | Admitting: Cardiology
